# Patient Record
Sex: Female | Born: 1945 | Race: Black or African American | Hispanic: No | Marital: Married | Smoking: Never smoker
Health system: Southern US, Community
[De-identification: ages and names within clinical notes are randomized; demographics above are authoritative.]

## PROBLEM LIST (undated history)

## (undated) DIAGNOSIS — F329 Major depressive disorder, single episode, unspecified: Secondary | ICD-10-CM

## (undated) DIAGNOSIS — I10 Essential (primary) hypertension: Secondary | ICD-10-CM

## (undated) DIAGNOSIS — F419 Anxiety disorder, unspecified: Secondary | ICD-10-CM

## (undated) DIAGNOSIS — F32A Depression, unspecified: Secondary | ICD-10-CM

## (undated) HISTORY — PX: BILATERAL CARPAL TUNNEL RELEASE: SHX6508

## (undated) HISTORY — PX: CARDIAC CATHETERIZATION: SHX172

---

## 1998-10-29 ENCOUNTER — Encounter: Payer: Self-pay | Admitting: Internal Medicine

## 1998-10-29 ENCOUNTER — Ambulatory Visit (HOSPITAL_COMMUNITY): Admission: RE | Admit: 1998-10-29 | Discharge: 1998-10-29 | Payer: Self-pay | Admitting: Internal Medicine

## 1998-11-27 ENCOUNTER — Other Ambulatory Visit: Admission: RE | Admit: 1998-11-27 | Discharge: 1998-11-27 | Payer: Self-pay | Admitting: Internal Medicine

## 2000-02-21 ENCOUNTER — Other Ambulatory Visit: Admission: RE | Admit: 2000-02-21 | Discharge: 2000-02-21 | Payer: Self-pay | Admitting: Internal Medicine

## 2000-03-03 ENCOUNTER — Ambulatory Visit (HOSPITAL_COMMUNITY): Admission: RE | Admit: 2000-03-03 | Discharge: 2000-03-03 | Payer: Self-pay | Admitting: Internal Medicine

## 2000-03-03 ENCOUNTER — Encounter: Payer: Self-pay | Admitting: Internal Medicine

## 2000-12-14 ENCOUNTER — Inpatient Hospital Stay (HOSPITAL_COMMUNITY): Admission: AD | Admit: 2000-12-14 | Discharge: 2000-12-16 | Payer: Self-pay | Admitting: Internal Medicine

## 2000-12-14 ENCOUNTER — Encounter: Payer: Self-pay | Admitting: Internal Medicine

## 2001-04-01 ENCOUNTER — Other Ambulatory Visit: Admission: RE | Admit: 2001-04-01 | Discharge: 2001-04-01 | Payer: Self-pay | Admitting: Internal Medicine

## 2004-11-11 ENCOUNTER — Encounter: Admission: RE | Admit: 2004-11-11 | Discharge: 2004-11-11 | Payer: Self-pay | Admitting: Internal Medicine

## 2006-01-09 ENCOUNTER — Emergency Department (HOSPITAL_COMMUNITY): Admission: EM | Admit: 2006-01-09 | Discharge: 2006-01-09 | Payer: Self-pay | Admitting: Family Medicine

## 2006-01-15 ENCOUNTER — Encounter: Admission: RE | Admit: 2006-01-15 | Discharge: 2006-01-15 | Payer: Self-pay | Admitting: Internal Medicine

## 2006-08-28 ENCOUNTER — Emergency Department (HOSPITAL_COMMUNITY): Admission: EM | Admit: 2006-08-28 | Discharge: 2006-08-28 | Payer: Self-pay | Admitting: Emergency Medicine

## 2008-07-07 ENCOUNTER — Emergency Department (HOSPITAL_COMMUNITY): Admission: EM | Admit: 2008-07-07 | Discharge: 2008-07-07 | Payer: Self-pay | Admitting: Family Medicine

## 2010-05-12 ENCOUNTER — Encounter: Payer: Self-pay | Admitting: Internal Medicine

## 2010-08-26 ENCOUNTER — Inpatient Hospital Stay (INDEPENDENT_AMBULATORY_CARE_PROVIDER_SITE_OTHER)
Admission: RE | Admit: 2010-08-26 | Discharge: 2010-08-26 | Disposition: A | Discharge status: Home / Self Care | Payer: Self-pay | Source: Ambulatory Visit | Attending: Family Medicine | Admitting: Family Medicine

## 2010-08-26 DIAGNOSIS — H5710 Ocular pain, unspecified eye: Secondary | ICD-10-CM

## 2010-09-06 NOTE — H&P (Signed)
[place]  Patient:    Ariel Fisher, Ariel Fisher Visit Number: [id number] MRN: [id number]          Service Type: MED Location: [phone] Attending Physician:  Alva Garnet. Dictated by:   Merlene Laughter Renae Gloss, M.D. Admit Date:  12/14/2000 Discharge Date: 12/16/2000                           History and Physical  CHIEF COMPLAINT:  Chest pain.  HISTORY OF PRESENT ILLNESS:  Ms. Ariel Fisher presents with a three-week history of midsternal chest pain.  She does state that the pain does appear worse after eating; however, she reports pain radiating to her left arm and she also complains of numbness.  She denies shortness of breath or abdominal pain or any other acute constitutional or systemic complaints at this time.  ALLERGIES:  LOTOMEX.  MEDICATIONS:  Maxzide 37.5/25 mg one p.o. q.d.  PAST MEDICAL HISTORY:  Hypertension, generalized anxiety/depression, menopause syndrome, seasonal allergies.  SOCIAL HISTORY:  Ms. Ariel Fisher is married and is self-employed as an Airline pilot. She denies tobacco, alcohol, or drugs of abuse.  FAMILY HISTORY:  Liver cancer in father, who is deceased at 5.  Lymphoma in sister, who is deceased at age 82.  REVIEW OF SYSTEMS:  As per HPI and patients history assessment; otherwise negative systems review.  PHYSICAL EXAMINATION:  VITAL SIGNS:  Blood pressure 130/92, pulse 88, weight 257 pounds, temperature 98.5.  GENERAL:  A well-developed, well-nourished black female in no acute distress but complaining of intermittent chest pain.  HEENT:  TMs within normal limits bilaterally.  No OP lesions.  PERRLA.  NECK:  Supple, no masses.  2+ carotids, no bruits.  CHEST:  Lungs clear to auscultation bilaterally.  CARDIAC:  S1, S2, regular rate and rhythm.  No murmurs, rubs, or gallops.  ABDOMEN:  Soft, nontender, nondistended, positive bowel sounds.  EXTREMITIES:  No clubbing, cyanosis, or edema.  NEUROLOGIC:   Alert and  oriented x 3.  Cranial nerves intact.  ASSESSMENT AND PLAN: 1. Chest pain.  The etiology of Ms. Ariel Fisher chest pain is unclear; however,    given her cardiac risk factor, including an abnormal electrocardiogram,    pain in the office, a cardiac etiology will need to be ruled out.  She will    be admitted to telemetry, and serial cardiac enzymes will be obtained.    Cardiology consult will also be obtained. 2. Hypertension.  Ms. Ariel Fisher blood pressure is fairly well-controlled at this    time.  She will be closely monitored while hospitalized, and medication    adjustments will be made if necessary. Dictated by:   Merlene Laughter Renae Gloss, M.D. Attending Physician:  Andi Devon R. DD:  01/18/01 TD:  01/18/01 Job: [phone] [id number]

## 2010-09-06 NOTE — Discharge Summary (Signed)
[place]  Patient:    Ariel Fisher, Ariel Fisher Visit Number: [id number] MRN: [id number]          Service Type: MED Location: [phone] Attending Physician:  Alva Garnet. Adm. Date:  12/14/2000 Disc. Date: 12/16/2000                             Discharge Summary  DISCHARGE DIAGNOSES: 1. Atypical chest pain. 2. Generalized anxiety. 3. Hypertension. 4. Abnormal EKG with normal heart catheterization December 15, 2000.  CONSULTS:  Dr. Lenise Herald, cardiologist.  HOSPITAL COURSE:  [name] was admitted with a three week history of intermittent mid sternal chest pain radiating to her left arm.  She denied nausea, shortness of breath, palpitations, diaphoresis associated with the chest pain; however, EKG obtained in the office revealed T-wave inversions in V3, aVF, and several other anterior leads.  She was admitted for further evaluation of chest pain in order to rule out a myocardial etiology.  Troponin and CK enzymes were unremarkable; however, given her cardiac risk factors including hypertension, obesity, and family history, it was thought that proceeding with the cardiac catheterization would be the best diagnostic study.  Cardiac catheterization per Dr. Jenne Campus on December 15, 2000 was unremarkable as she had no blockages in her coronary arteries.  Her ejection fraction was between 50-55%.  The most probable etiology for chest pain is a GI cause such as acid peptic disease or musculoskeletal strain.  I also think that her symptoms are exacerbated by general anxiety as a result of both personal and job related stressors.  [name] hypertension was well controlled during this admission.  DISCHARGE MEDICATIONS:  Maxzide 37.5/25 one p.o. q.d.  FOLLOW-UP:  [name] will be seen by Dr. Andi Devon on Tuesday, September 10 at 9:15 a.m.  DISPOSITION:  [name] had no chest pain complaints, was ambulating and eating without  complications. Attending Physician:  Andi Devon R. DD:  12/16/00 TD:  12/16/00 Job: [id number]

## 2010-09-06 NOTE — Cardiovascular Report (Signed)
[place]. [facility]  Patient:    Ariel Fisher, Ariel Fisher Visit Number: [id number] MRN: [id number]          Service Type: MED Location: [phone] Attending Physician:  Alva Garnet. Dictated by:   Lenise Herald, M.D. Proc. Date: 12/15/00 Adm. Date:  12/14/2000   CC:         Merlene Laughter. Renae Gloss, M.D.   Cardiac Catheterization  PROCEDURES: 1. Left heart catheterization. 2. Coronary angiography. 3. Left ventriculogram.  ATTENDING:  Lenise Herald, M.D.  COMPLICATIONS:  None.  INDICATIONS:  Ariel Fisher is a 65 year old female patient of Dr. Cala Bradford R. Renae Gloss, with a history of hypertension and mild obesity.  She was admitted on December 14, 2000 with chest pain radiating to her back and to her left arm.  The patient subsequently ruled out for myocardial infarction; however, given her recurrent symptoms with chest pain while in the hospital, she is now referred for cardiac catheterization to define her coronary anatomy.  DESCRIPTION OF PROCEDURE:  After getting an informed written consent, the patient was brought to the cardiac catheterization laboratory, where the right and left groins were shaved, prepped and draped in the usual sterile fashion. EKG monitoring was established.  Using the modified Seldinger technique, a 6-French arterial sheath was inserted in the right femoral artery.  Then 6-French diagnostic catheters were used to perform diagnostic angiography.  ANGIOGRAPHY: 1. LEFT MAIN:  Large with no significant disease. 2. LEFT ANTERIOR DESCENDING ARTERY:  A large vessel which coursed to the    apex.  There is one diagonal branch.  The LAD has no significant disease.    The first diagonal is a medium-sized vessel which bifurcated distally    and had no significant disease. 3. LEFT CIRCUMFLEX:  A large vessel which is dominant and gives rise to two    obtuse marginal branches as well as a PDA.  The AV circumflex has no    significant  disease.  The first OM is a large vessel which bifurcates    distally and has no significant disease.  The second OM is a medium-sized    vessel with no significant disease.  The PDA is a medium-sized vessel with    no significant disease. 4. RIGHT CORONARY ARTERY:  A small vessel which is nondominant. There is a    mild 30% ostial narrowing.  LEFT VENTRICULOGRAM:  Reveals preserved EF at 50-55%.  HEMODYNAMICS: 1. Arterial pressure:  103/70. 2. LV pressure:  104/60. 3. LEFT VENTRICULAR END-DIASTOLIC PRESSURE:  12.  CONCLUSIONS: 1. No significant coronary artery disease. 2. Normal left ventricular systolic function.Dictated by:   Lenise Herald, M.D. Attending Physician:  Andi Devon R. DD:  12/15/00 TD:  12/16/00 Job: [id number]

## 2012-01-12 ENCOUNTER — Other Ambulatory Visit: Payer: Self-pay | Admitting: Internal Medicine

## 2012-01-12 ENCOUNTER — Ambulatory Visit
Admission: RE | Admit: 2012-01-12 | Discharge: 2012-01-12 | Disposition: A | Discharge status: Home / Self Care | Payer: No Typology Code available for payment source | Source: Ambulatory Visit | Attending: Internal Medicine | Admitting: Internal Medicine

## 2012-01-12 DIAGNOSIS — R0789 Other chest pain: Secondary | ICD-10-CM

## 2012-05-24 ENCOUNTER — Other Ambulatory Visit: Payer: Self-pay | Admitting: Internal Medicine

## 2012-05-24 DIAGNOSIS — Z1231 Encounter for screening mammogram for malignant neoplasm of breast: Secondary | ICD-10-CM

## 2012-06-04 ENCOUNTER — Ambulatory Visit: Payer: Self-pay

## 2012-06-07 ENCOUNTER — Other Ambulatory Visit: Payer: Self-pay | Admitting: Orthopedic Surgery

## 2012-06-07 DIAGNOSIS — M545 Low back pain, unspecified: Secondary | ICD-10-CM

## 2012-06-07 DIAGNOSIS — M25552 Pain in left hip: Secondary | ICD-10-CM

## 2012-06-08 ENCOUNTER — Ambulatory Visit
Admission: RE | Admit: 2012-06-08 | Discharge: 2012-06-08 | Disposition: A | Discharge status: Home / Self Care | Payer: Medicare HMO | Source: Ambulatory Visit | Attending: Internal Medicine | Admitting: Internal Medicine

## 2012-06-08 DIAGNOSIS — Z1231 Encounter for screening mammogram for malignant neoplasm of breast: Secondary | ICD-10-CM

## 2012-06-12 ENCOUNTER — Ambulatory Visit
Admission: RE | Admit: 2012-06-12 | Discharge: 2012-06-12 | Disposition: A | Discharge status: Home / Self Care | Payer: Medicare HMO | Source: Ambulatory Visit | Attending: Orthopedic Surgery | Admitting: Orthopedic Surgery

## 2012-06-12 DIAGNOSIS — M25552 Pain in left hip: Secondary | ICD-10-CM

## 2012-06-12 DIAGNOSIS — M545 Low back pain, unspecified: Secondary | ICD-10-CM

## 2013-03-10 ENCOUNTER — Other Ambulatory Visit: Payer: Self-pay | Admitting: Neurosurgery

## 2013-03-10 DIAGNOSIS — M48061 Spinal stenosis, lumbar region without neurogenic claudication: Secondary | ICD-10-CM

## 2013-03-18 ENCOUNTER — Ambulatory Visit
Admission: RE | Admit: 2013-03-18 | Discharge: 2013-03-18 | Disposition: A | Discharge status: Home / Self Care | Payer: Medicare HMO | Source: Ambulatory Visit | Attending: Neurosurgery | Admitting: Neurosurgery

## 2013-03-18 VITALS — BP 107/60 | HR 65

## 2013-03-18 DIAGNOSIS — M48061 Spinal stenosis, lumbar region without neurogenic claudication: Secondary | ICD-10-CM

## 2013-03-18 MED ORDER — IOHEXOL 180 MG/ML  SOLN
15.0000 mL | Freq: Once | INTRAMUSCULAR | Status: AC | PRN
  Administered 2013-03-18: 15 mL via INTRATHECAL

## 2013-03-18 MED ORDER — ONDANSETRON HCL 4 MG/2ML IJ SOLN
4.0000 mg | Freq: Once | INTRAMUSCULAR | Status: AC
  Administered 2013-03-18: 4 mg via INTRAMUSCULAR

## 2013-03-18 MED ORDER — DIAZEPAM 5 MG PO TABS
5.0000 mg | ORAL_TABLET | Freq: Once | ORAL | Status: AC
  Administered 2013-03-18: 5 mg via ORAL

## 2013-03-18 MED ORDER — MEPERIDINE HCL 100 MG/ML IJ SOLN
100.0000 mg | Freq: Once | INTRAMUSCULAR | Status: AC
  Administered 2013-03-18: 100 mg via INTRAMUSCULAR

## 2013-03-18 NOTE — Progress Notes (Signed)
Patient states her last dose of Tramadol was at least two days ago.  Dr. Carlota Raspberry in to consent patient (with husband present).  jkl

## 2013-05-04 ENCOUNTER — Other Ambulatory Visit: Payer: Self-pay | Admitting: Neurosurgery

## 2013-07-26 ENCOUNTER — Encounter (HOSPITAL_COMMUNITY): Payer: Self-pay | Admitting: Pharmacy Technician

## 2013-07-28 NOTE — Pre-Procedure Instructions (Signed)
Ariel Fisher  07/28/2013   Your procedure is scheduled on:  Mon, April 20 @ 7:30 AM  [facility]  at 5:30 AM.  Call this number if you have problems the morning of surgery: [phone]   Remember:   Do not eat food or drink liquids after midnight.   Take these medicines the morning of surgery with A SIP OF WATER: Alprazolam(Xanax),Amlodipine(Norvasc),and Pain Pill(if needed)              Stop taking your Ibuprofen and Fish Oil. No Goody's,BC's,Aleve,Aspirin,or any Herbal Medications   Do not wear jewelry, make-up or nail polish.  Do not wear lotions, powders, or perfumes. You may wear deodorant.  Do not shave 48 hours prior to surgery.  Do not bring valuables to the hospital.  [facility] not responsible                  for any belongings or valuables.               Contacts, dentures or bridgework may not be worn into surgery.  Leave suitcase in the car. After surgery it may be brought to your room.  For patients admitted to the hospital, discharge time is determined by your                treatment team.                   Special Instructions:  [place] - Preparing for Surgery  Before surgery, you can play an important role.  Because skin is not sterile, your skin needs to be as free of germs as possible.  You can reduce the number of germs on you skin by washing with CHG (chlorahexidine gluconate) soap before surgery.  CHG is an antiseptic cleaner which kills germs and bonds with the skin to continue killing germs even after washing.  Please DO NOT use if you have an allergy to CHG or antibacterial soaps.  If your skin becomes reddened/irritated stop using the CHG and inform your nurse when you arrive at Short Stay.  Do not shave (including legs and underarms) for at least 48 hours prior to the first CHG shower.  You may shave your face.  Please follow these instructions carefully:   1.  Shower with CHG Soap the night before surgery and the                                 morning of Surgery.  2.  If you choose to wash your hair, wash your hair first as usual with your       normal shampoo.  3.  After you shampoo, rinse your hair and body thoroughly to remove the                      Shampoo.  4.  Use CHG as you would any other liquid soap.  You can apply chg directly       to the skin and wash gently with scrungie or a clean washcloth.  5.  Apply the CHG Soap to your body ONLY FROM THE NECK DOWN.        Do not use on open wounds or open sores.  Avoid contact with your eyes,       ears, mouth and genitals (private parts).  Wash genitals (private parts)  with your normal soap.  6.  Wash thoroughly, paying special attention to the area where your surgery        will be performed.  7.  Thoroughly rinse your body with warm water from the neck down.  8.  DO NOT shower/wash with your normal soap after using and rinsing off       the CHG Soap.  9.  Pat yourself dry with a clean towel.            10.  Wear clean pajamas.            11.  Place clean sheets on your bed the night of your first shower and do not        sleep with pets.  Day of Surgery  Do not apply any lotions/deoderants the morning of surgery.  Please wear clean clothes to the hospital/surgery center.     Please read over the following fact sheets that you were given: Pain Booklet, Coughing and Deep Breathing, Blood Transfusion Information, Total Joint Packet and MRSA Information

## 2013-07-29 ENCOUNTER — Encounter (HOSPITAL_COMMUNITY): Payer: Self-pay

## 2013-07-29 ENCOUNTER — Encounter (HOSPITAL_COMMUNITY)
Admission: RE | Admit: 2013-07-29 | Discharge: 2013-07-29 | Disposition: A | Discharge status: Home / Self Care | Payer: Medicare HMO | Source: Ambulatory Visit | Attending: Neurosurgery | Admitting: Neurosurgery

## 2013-07-29 DIAGNOSIS — Z01810 Encounter for preprocedural cardiovascular examination: Secondary | ICD-10-CM | POA: Insufficient documentation

## 2013-07-29 DIAGNOSIS — Z01818 Encounter for other preprocedural examination: Secondary | ICD-10-CM | POA: Insufficient documentation

## 2013-07-29 DIAGNOSIS — Z01812 Encounter for preprocedural laboratory examination: Secondary | ICD-10-CM | POA: Insufficient documentation

## 2013-07-29 HISTORY — DX: Major depressive disorder, single episode, unspecified: F32.9

## 2013-07-29 HISTORY — DX: Depression, unspecified: F32.A

## 2013-07-29 HISTORY — DX: Anxiety disorder, unspecified: F41.9

## 2013-07-29 HISTORY — DX: Essential (primary) hypertension: I10

## 2013-07-29 LAB — CBC
HEMATOCRIT: 40.3 % (ref 36.0–46.0)
HEMOGLOBIN: 13.4 g/dL (ref 12.0–15.0)
MCH: 30.8 pg (ref 26.0–34.0)
MCHC: 33.3 g/dL (ref 30.0–36.0)
MCV: 92.6 fL (ref 78.0–100.0)
Platelets: 333 10*3/uL (ref 150–400)
RBC: 4.35 MIL/uL (ref 3.87–5.11)
RDW: 12.7 % (ref 11.5–15.5)
WBC: 5.2 10*3/uL (ref 4.0–10.5)

## 2013-07-29 LAB — TYPE AND SCREEN
ABO/RH(D): A POS
Antibody Screen: NEGATIVE

## 2013-07-29 LAB — BASIC METABOLIC PANEL
BUN: 15 mg/dL (ref 6–23)
CHLORIDE: 107 meq/L (ref 96–112)
CO2: 23 meq/L (ref 19–32)
Calcium: 9.7 mg/dL (ref 8.4–10.5)
Creatinine, Ser: 0.63 mg/dL (ref 0.50–1.10)
GFR calc Af Amer: >90 mL/min (ref >90)
GLUCOSE: 96 mg/dL (ref 70–99)
POTASSIUM: 3.8 meq/L (ref 3.7–5.3)
Sodium: 145 mEq/L (ref 137–147)

## 2013-07-29 LAB — SURGICAL PCR SCREEN
MRSA, PCR: NEGATIVE
Staphylococcus aureus: NEGATIVE

## 2013-07-29 LAB — ABO/RH: ABO/RH(D): A POS

## 2013-08-04 ENCOUNTER — Encounter (HOSPITAL_COMMUNITY): Payer: Self-pay | Admitting: Pharmacy Technician

## 2013-08-08 ENCOUNTER — Encounter (HOSPITAL_COMMUNITY): Payer: Self-pay | Admitting: *Deleted

## 2013-08-08 ENCOUNTER — Encounter (HOSPITAL_COMMUNITY)
Admission: RE | Disposition: A | Discharge status: Home / Self Care | Payer: Medicare HMO | Source: Ambulatory Visit | Attending: Neurosurgery

## 2013-08-08 ENCOUNTER — Inpatient Hospital Stay (HOSPITAL_COMMUNITY): Payer: Medicare HMO | Admitting: Anesthesiology

## 2013-08-08 ENCOUNTER — Inpatient Hospital Stay (HOSPITAL_COMMUNITY)
Admission: RE | Admit: 2013-08-08 | Discharge: 2013-08-12 | DRG: 460 | Disposition: A | Discharge status: Home / Self Care | Payer: Medicare HMO | Source: Ambulatory Visit | Attending: Neurosurgery | Admitting: Neurosurgery

## 2013-08-08 ENCOUNTER — Inpatient Hospital Stay (HOSPITAL_COMMUNITY): Payer: Medicare HMO

## 2013-08-08 ENCOUNTER — Encounter (HOSPITAL_COMMUNITY): Payer: Medicare HMO | Admitting: Anesthesiology

## 2013-08-08 DIAGNOSIS — Z23 Encounter for immunization: Secondary | ICD-10-CM

## 2013-08-08 DIAGNOSIS — Z79899 Other long term (current) drug therapy: Secondary | ICD-10-CM

## 2013-08-08 DIAGNOSIS — F329 Major depressive disorder, single episode, unspecified: Secondary | ICD-10-CM | POA: Diagnosis present

## 2013-08-08 DIAGNOSIS — F411 Generalized anxiety disorder: Secondary | ICD-10-CM | POA: Diagnosis present

## 2013-08-08 DIAGNOSIS — F3289 Other specified depressive episodes: Secondary | ICD-10-CM | POA: Diagnosis present

## 2013-08-08 DIAGNOSIS — Q762 Congenital spondylolisthesis: Principal | ICD-10-CM

## 2013-08-08 DIAGNOSIS — I10 Essential (primary) hypertension: Secondary | ICD-10-CM | POA: Diagnosis present

## 2013-08-08 DIAGNOSIS — M47817 Spondylosis without myelopathy or radiculopathy, lumbosacral region: Secondary | ICD-10-CM | POA: Diagnosis present

## 2013-08-08 DIAGNOSIS — M4316 Spondylolisthesis, lumbar region: Secondary | ICD-10-CM | POA: Diagnosis present

## 2013-08-08 DIAGNOSIS — M5137 Other intervertebral disc degeneration, lumbosacral region: Secondary | ICD-10-CM | POA: Diagnosis present

## 2013-08-08 DIAGNOSIS — M51379 Other intervertebral disc degeneration, lumbosacral region without mention of lumbar back pain or lower extremity pain: Secondary | ICD-10-CM | POA: Diagnosis present

## 2013-08-08 SURGERY — POSTERIOR LUMBAR FUSION 1 LEVEL
Anesthesia: General | Site: Back

## 2013-08-08 MED ORDER — DEXAMETHASONE SODIUM PHOSPHATE 10 MG/ML IJ SOLN
INTRAMUSCULAR | Status: AC
  Filled 2013-08-08: qty 1

## 2013-08-08 MED ORDER — GLYCOPYRROLATE 0.2 MG/ML IJ SOLN
INTRAMUSCULAR | Status: DC | PRN
  Administered 2013-08-08: 0.4 mg via INTRAVENOUS

## 2013-08-08 MED ORDER — FENTANYL CITRATE 0.05 MG/ML IJ SOLN
INTRAMUSCULAR | Status: AC
  Filled 2013-08-08: qty 5

## 2013-08-08 MED ORDER — HYDROMORPHONE HCL PF 1 MG/ML IJ SOLN
0.2500 mg | INTRAMUSCULAR | Status: DC | PRN
  Administered 2013-08-08 (×2): 0.25 mg via INTRAVENOUS
  Administered 2013-08-08 (×2): 0.5 mg via INTRAVENOUS

## 2013-08-08 MED ORDER — DOCUSATE SODIUM 100 MG PO CAPS
100.0000 mg | ORAL_CAPSULE | Freq: Two times a day (BID) | ORAL | Status: DC
  Administered 2013-08-08 – 2013-08-12 (×7): 100 mg via ORAL
  Filled 2013-08-08 (×7): qty 1

## 2013-08-08 MED ORDER — PROPOFOL 10 MG/ML IV BOLUS
INTRAVENOUS | Status: AC
Start: 2013-08-08 — End: 2013-08-08
  Filled 2013-08-08: qty 20

## 2013-08-08 MED ORDER — ACETAMINOPHEN 650 MG RE SUPP: 650.0000 mg | RECTAL | Status: DC | PRN

## 2013-08-08 MED ORDER — ACETAMINOPHEN 325 MG PO TABS
650.0000 mg | ORAL_TABLET | ORAL | Status: DC | PRN
  Administered 2013-08-10: 650 mg via ORAL
  Filled 2013-08-08: qty 2

## 2013-08-08 MED ORDER — HYDROMORPHONE HCL PF 1 MG/ML IJ SOLN
INTRAMUSCULAR | Status: AC
  Filled 2013-08-08: qty 1

## 2013-08-08 MED ORDER — NEOSTIGMINE METHYLSULFATE 1 MG/ML IJ SOLN
INTRAMUSCULAR | Status: AC
  Filled 2013-08-08: qty 10

## 2013-08-08 MED ORDER — NEOSTIGMINE METHYLSULFATE 1 MG/ML IJ SOLN
INTRAMUSCULAR | Status: DC | PRN
  Administered 2013-08-08: 3 mg via INTRAVENOUS

## 2013-08-08 MED ORDER — PHENYLEPHRINE 40 MCG/ML (10ML) SYRINGE FOR IV PUSH (FOR BLOOD PRESSURE SUPPORT)
PREFILLED_SYRINGE | INTRAVENOUS | Status: AC
  Filled 2013-08-08: qty 10

## 2013-08-08 MED ORDER — CEFAZOLIN SODIUM-DEXTROSE 2-3 GM-% IV SOLR
INTRAVENOUS | Status: DC | PRN
  Administered 2013-08-08: 2 g via INTRAVENOUS

## 2013-08-08 MED ORDER — ONDANSETRON HCL 4 MG/2ML IJ SOLN
INTRAMUSCULAR | Status: DC | PRN
  Administered 2013-08-08: 4 mg via INTRAVENOUS

## 2013-08-08 MED ORDER — BACITRACIN 50000 UNITS IM SOLR
INTRAMUSCULAR | Status: DC | PRN
  Administered 2013-08-08: 09:00:00

## 2013-08-08 MED ORDER — ONDANSETRON HCL 4 MG/2ML IJ SOLN
4.0000 mg | INTRAMUSCULAR | Status: DC | PRN
  Administered 2013-08-10 (×2): 4 mg via INTRAVENOUS
  Filled 2013-08-08 (×2): qty 2

## 2013-08-08 MED ORDER — OXYCODONE HCL 5 MG/5ML PO SOLN: 5.0000 mg | Freq: Once | ORAL | Status: AC | PRN

## 2013-08-08 MED ORDER — DIAZEPAM 5 MG PO TABS
ORAL_TABLET | ORAL | Status: AC
  Filled 2013-08-08: qty 1

## 2013-08-08 MED ORDER — MIDAZOLAM HCL 5 MG/5ML IJ SOLN
INTRAMUSCULAR | Status: DC | PRN
  Administered 2013-08-08: 2 mg via INTRAVENOUS

## 2013-08-08 MED ORDER — THROMBIN 20000 UNITS EX SOLR
CUTANEOUS | Status: DC | PRN
  Administered 2013-08-08: 09:00:00 via TOPICAL

## 2013-08-08 MED ORDER — ROCURONIUM BROMIDE 50 MG/5ML IV SOLN
INTRAVENOUS | Status: AC
  Filled 2013-08-08: qty 1

## 2013-08-08 MED ORDER — OXYCODONE HCL 5 MG PO TABS
5.0000 mg | ORAL_TABLET | Freq: Once | ORAL | Status: AC | PRN
  Administered 2013-08-08: 5 mg via ORAL

## 2013-08-08 MED ORDER — EPHEDRINE SULFATE 50 MG/ML IJ SOLN
INTRAMUSCULAR | Status: AC
  Filled 2013-08-08: qty 1

## 2013-08-08 MED ORDER — PROMETHAZINE HCL 25 MG/ML IJ SOLN: 6.2500 mg | INTRAMUSCULAR | Status: DC | PRN

## 2013-08-08 MED ORDER — MORPHINE SULFATE 2 MG/ML IJ SOLN
1.0000 mg | INTRAMUSCULAR | Status: DC | PRN
  Administered 2013-08-08 – 2013-08-09 (×3): 2 mg via INTRAVENOUS
  Administered 2013-08-09: 4 mg via INTRAVENOUS
  Filled 2013-08-08 (×4): qty 1
  Filled 2013-08-08: qty 2

## 2013-08-08 MED ORDER — BUPIVACAINE-EPINEPHRINE PF 0.5-1:200000 % IJ SOLN
INTRAMUSCULAR | Status: DC | PRN
  Administered 2013-08-08: 10 mL via PERINEURAL

## 2013-08-08 MED ORDER — PNEUMOCOCCAL VAC POLYVALENT 25 MCG/0.5ML IJ INJ
0.5000 mL | INJECTION | INTRAMUSCULAR | Status: AC
  Administered 2013-08-09: 0.5 mL via INTRAMUSCULAR
  Filled 2013-08-08: qty 0.5

## 2013-08-08 MED ORDER — ROCURONIUM BROMIDE 100 MG/10ML IV SOLN
INTRAVENOUS | Status: DC | PRN
  Administered 2013-08-08: 50 mg via INTRAVENOUS

## 2013-08-08 MED ORDER — SUCCINYLCHOLINE CHLORIDE 20 MG/ML IJ SOLN
INTRAMUSCULAR | Status: AC
  Filled 2013-08-08: qty 1

## 2013-08-08 MED ORDER — LIDOCAINE HCL (CARDIAC) 20 MG/ML IV SOLN
INTRAVENOUS | Status: AC
  Filled 2013-08-08: qty 5

## 2013-08-08 MED ORDER — 0.9 % SODIUM CHLORIDE (POUR BTL) OPTIME
TOPICAL | Status: DC | PRN
  Administered 2013-08-08: 1000 mL

## 2013-08-08 MED ORDER — CEFAZOLIN SODIUM-DEXTROSE 2-3 GM-% IV SOLR
2.0000 g | Freq: Three times a day (TID) | INTRAVENOUS | Status: AC
  Administered 2013-08-08 (×2): 2 g via INTRAVENOUS
  Filled 2013-08-08 (×2): qty 50

## 2013-08-08 MED ORDER — DIAZEPAM 5 MG PO TABS
5.0000 mg | ORAL_TABLET | Freq: Four times a day (QID) | ORAL | Status: DC | PRN
  Administered 2013-08-08 – 2013-08-09 (×4): 5 mg via ORAL
  Filled 2013-08-08 (×3): qty 1

## 2013-08-08 MED ORDER — PHENOL 1.4 % MT LIQD: 1.0000 | OROMUCOSAL | Status: DC | PRN

## 2013-08-08 MED ORDER — SODIUM CHLORIDE 0.9 % IJ SOLN
INTRAMUSCULAR | Status: AC
  Filled 2013-08-08: qty 10

## 2013-08-08 MED ORDER — OXYCODONE-ACETAMINOPHEN 5-325 MG PO TABS
1.0000 | ORAL_TABLET | ORAL | Status: DC | PRN
  Administered 2013-08-08 – 2013-08-12 (×16): 2 via ORAL
  Filled 2013-08-08 (×16): qty 2

## 2013-08-08 MED ORDER — BACITRACIN ZINC 500 UNIT/GM EX OINT
TOPICAL_OINTMENT | CUTANEOUS | Status: DC | PRN
  Administered 2013-08-08: 1 via TOPICAL

## 2013-08-08 MED ORDER — HYDROCODONE-ACETAMINOPHEN 10-325 MG PO TABS: 1.0000 | ORAL_TABLET | ORAL | Status: DC | PRN

## 2013-08-08 MED ORDER — ONDANSETRON HCL 4 MG/2ML IJ SOLN
INTRAMUSCULAR | Status: AC
  Filled 2013-08-08: qty 2

## 2013-08-08 MED ORDER — WHITE PETROLATUM GEL
Status: AC
  Filled 2013-08-08: qty 5

## 2013-08-08 MED ORDER — PHENYLEPHRINE HCL 10 MG/ML IJ SOLN
INTRAMUSCULAR | Status: AC
  Filled 2013-08-08: qty 1

## 2013-08-08 MED ORDER — LACTATED RINGERS IV SOLN
INTRAVENOUS | Status: DC | PRN
  Administered 2013-08-08 (×3): via INTRAVENOUS

## 2013-08-08 MED ORDER — BUPIVACAINE LIPOSOME 1.3 % IJ SUSP
20.0000 mL | INTRAMUSCULAR | Status: DC
  Filled 2013-08-08: qty 20

## 2013-08-08 MED ORDER — PHENYLEPHRINE HCL 10 MG/ML IJ SOLN
INTRAMUSCULAR | Status: DC | PRN
  Administered 2013-08-08 (×4): 80 ug via INTRAVENOUS

## 2013-08-08 MED ORDER — LIDOCAINE HCL (CARDIAC) 20 MG/ML IV SOLN
INTRAVENOUS | Status: DC | PRN
  Administered 2013-08-08: 50 mg via INTRAVENOUS

## 2013-08-08 MED ORDER — DEXAMETHASONE SODIUM PHOSPHATE 10 MG/ML IJ SOLN
INTRAMUSCULAR | Status: DC | PRN
  Administered 2013-08-08: 10 mg via INTRAVENOUS

## 2013-08-08 MED ORDER — ALUM & MAG HYDROXIDE-SIMETH 200-200-20 MG/5ML PO SUSP: 30.0000 mL | Freq: Four times a day (QID) | ORAL | Status: DC | PRN

## 2013-08-08 MED ORDER — AMLODIPINE BESYLATE 2.5 MG PO TABS
2.5000 mg | ORAL_TABLET | Freq: Every day | ORAL | Status: DC
  Administered 2013-08-09 – 2013-08-12 (×2): 2.5 mg via ORAL
  Filled 2013-08-08 (×5): qty 1

## 2013-08-08 MED ORDER — PHENYLEPHRINE HCL 10 MG/ML IJ SOLN
10.0000 mg | INTRAVENOUS | Status: DC | PRN
  Administered 2013-08-08: 25 ug/min via INTRAVENOUS

## 2013-08-08 MED ORDER — MIDAZOLAM HCL 2 MG/2ML IJ SOLN
INTRAMUSCULAR | Status: AC
  Filled 2013-08-08: qty 2

## 2013-08-08 MED ORDER — PROPOFOL 10 MG/ML IV BOLUS
INTRAVENOUS | Status: DC | PRN
  Administered 2013-08-08: 130 mg via INTRAVENOUS

## 2013-08-08 MED ORDER — MENTHOL 3 MG MT LOZG: 1.0000 | LOZENGE | OROMUCOSAL | Status: DC | PRN

## 2013-08-08 MED ORDER — GLYCOPYRROLATE 0.2 MG/ML IJ SOLN
INTRAMUSCULAR | Status: AC
  Filled 2013-08-08: qty 2

## 2013-08-08 MED ORDER — BUPIVACAINE LIPOSOME 1.3 % IJ SUSP
INTRAMUSCULAR | Status: DC | PRN
  Administered 2013-08-08: 20 mL

## 2013-08-08 MED ORDER — FENTANYL CITRATE 0.05 MG/ML IJ SOLN
INTRAMUSCULAR | Status: DC | PRN
  Administered 2013-08-08 (×2): 100 ug via INTRAVENOUS
  Administered 2013-08-08 (×2): 50 ug via INTRAVENOUS
  Administered 2013-08-08 (×2): 100 ug via INTRAVENOUS
  Administered 2013-08-08 (×3): 50 ug via INTRAVENOUS
  Administered 2013-08-08: 100 ug via INTRAVENOUS

## 2013-08-08 MED ORDER — OXYCODONE HCL 5 MG PO TABS
ORAL_TABLET | ORAL | Status: AC
  Filled 2013-08-08: qty 1

## 2013-08-08 MED ORDER — LACTATED RINGERS IV SOLN
INTRAVENOUS | Status: DC
  Administered 2013-08-09: 1000 mL via INTRAVENOUS

## 2013-08-08 SURGICAL SUPPLY — 70 items
APL SKNCLS STERI-STRIP NONHPOA (GAUZE/BANDAGES/DRESSINGS) ×1
BAG DECANTER FOR FLEXI CONT (MISCELLANEOUS) ×2 IMPLANT
BENZOIN TINCTURE PRP APPL 2/3 (GAUZE/BANDAGES/DRESSINGS) ×2 IMPLANT
BLADE SURG ROTATE 9660 (MISCELLANEOUS) IMPLANT
BRUSH SCRUB EZ PLAIN DRY (MISCELLANEOUS) ×2 IMPLANT
BUR ACORN 6.0 (BURR) ×2 IMPLANT
BUR MATCHSTICK NEURO 3.0 LAGG (BURR) ×2 IMPLANT
CANISTER SUCT 3000ML (MISCELLANEOUS) ×2 IMPLANT
CAP REVERE LOCKING (Cap) ×4 IMPLANT
CONT SPEC 4OZ CLIKSEAL STRL BL (MISCELLANEOUS) ×2 IMPLANT
COVER BACK TABLE 24X17X13 BIG (DRAPES) IMPLANT
COVER TABLE BACK 60X90 (DRAPES) ×2 IMPLANT
DRAPE C-ARM 42X72 X-RAY (DRAPES) ×4 IMPLANT
DRAPE LAPAROTOMY 100X72X124 (DRAPES) ×2 IMPLANT
DRAPE POUCH INSTRU U-SHP 10X18 (DRAPES) ×2 IMPLANT
DRAPE PROXIMA HALF (DRAPES) ×2 IMPLANT
DRAPE SURG 17X23 STRL (DRAPES) ×8 IMPLANT
ELECT BLADE 4.0 EZ CLEAN MEGAD (MISCELLANEOUS) ×2
ELECT REM PT RETURN 9FT ADLT (ELECTROSURGICAL) ×2
ELECTRODE BLDE 4.0 EZ CLN MEGD (MISCELLANEOUS) ×1 IMPLANT
ELECTRODE REM PT RTRN 9FT ADLT (ELECTROSURGICAL) ×1 IMPLANT
EVACUATOR 1/8 PVC DRAIN (DRAIN) ×1 IMPLANT
GAUZE SPONGE 4X4 16PLY XRAY LF (GAUZE/BANDAGES/DRESSINGS) ×2 IMPLANT
GLOVE BIO SURGEON STRL SZ8.5 (GLOVE) ×4 IMPLANT
GLOVE BIOGEL PI IND STRL 8.5 (GLOVE) IMPLANT
GLOVE BIOGEL PI INDICATOR 8.5 (GLOVE) ×1
GLOVE ECLIPSE 8.5 STRL (GLOVE) ×1 IMPLANT
GLOVE EXAM NITRILE LRG STRL (GLOVE) IMPLANT
GLOVE EXAM NITRILE MD LF STRL (GLOVE) IMPLANT
GLOVE EXAM NITRILE XL STR (GLOVE) IMPLANT
GLOVE EXAM NITRILE XS STR PU (GLOVE) IMPLANT
GLOVE SS BIOGEL STRL SZ 8 (GLOVE) ×2 IMPLANT
GLOVE SUPERSENSE BIOGEL SZ 8 (GLOVE) ×2
GOWN BRE IMP SLV AUR LG STRL (GOWN DISPOSABLE) IMPLANT
GOWN BRE IMP SLV AUR XL STRL (GOWN DISPOSABLE) ×2 IMPLANT
GOWN STRL REIN 2XL LVL4 (GOWN DISPOSABLE) IMPLANT
GOWN STRL REUS W/ TWL XL LVL3 (GOWN DISPOSABLE) IMPLANT
GOWN STRL REUS W/TWL 2XL LVL3 (GOWN DISPOSABLE) ×1 IMPLANT
GOWN STRL REUS W/TWL XL LVL3 (GOWN DISPOSABLE) ×6
KIT BASIN OR (CUSTOM PROCEDURE TRAY) ×2 IMPLANT
KIT ROOM TURNOVER OR (KITS) ×2 IMPLANT
NDL HYPO 21X1.5 SAFETY (NEEDLE) IMPLANT
NEEDLE HYPO 21X1.5 SAFETY (NEEDLE) ×2 IMPLANT
NEEDLE HYPO 22GX1.5 SAFETY (NEEDLE) ×2 IMPLANT
NS IRRIG 1000ML POUR BTL (IV SOLUTION) ×2 IMPLANT
PACK FOAM VITOSS 10CC (Orthopedic Implant) ×1 IMPLANT
PACK LAMINECTOMY NEURO (CUSTOM PROCEDURE TRAY) ×2 IMPLANT
PAD ARMBOARD 7.5X6 YLW CONV (MISCELLANEOUS) ×6 IMPLANT
PATTIES SURGICAL .5 X1 (DISPOSABLE) IMPLANT
PUTTY 10ML ACTIFUSE ABX (Putty) ×1 IMPLANT
ROD REVERE 6.35 40MM (Rod) ×2 IMPLANT
SCREW 7.5X50MM (Screw) ×2 IMPLANT
SCREW REVERE 6.35 75X55MM (Screw) ×2 IMPLANT
SPACER SUSTAIN O 10X26 13MM (Spacer) ×2 IMPLANT
SPONGE GAUZE 4X4 12PLY (GAUZE/BANDAGES/DRESSINGS) ×2 IMPLANT
SPONGE LAP 4X18 X RAY DECT (DISPOSABLE) ×1 IMPLANT
SPONGE NEURO XRAY DETECT 1X3 (DISPOSABLE) IMPLANT
SPONGE SURGIFOAM ABS GEL 100 (HEMOSTASIS) ×2 IMPLANT
STRIP CLOSURE SKIN 1/2X4 (GAUZE/BANDAGES/DRESSINGS) ×2 IMPLANT
SUT VIC AB 1 CT1 18XBRD ANBCTR (SUTURE) ×2 IMPLANT
SUT VIC AB 1 CT1 8-18 (SUTURE) ×4
SUT VIC AB 2-0 CP2 18 (SUTURE) ×4 IMPLANT
SYR 20CC LL (SYRINGE) ×1 IMPLANT
SYR 20ML ECCENTRIC (SYRINGE) ×2 IMPLANT
TAPE CLOTH SURG 4X10 WHT LF (GAUZE/BANDAGES/DRESSINGS) ×1 IMPLANT
TAPE STRIPS DRAPE STRL (GAUZE/BANDAGES/DRESSINGS) ×1 IMPLANT
TOWEL OR 17X24 6PK STRL BLUE (TOWEL DISPOSABLE) ×2 IMPLANT
TOWEL OR 17X26 10 PK STRL BLUE (TOWEL DISPOSABLE) ×2 IMPLANT
TRAY FOLEY CATH 14FRSI W/METER (CATHETERS) ×2 IMPLANT
WATER STERILE IRR 1000ML POUR (IV SOLUTION) ×2 IMPLANT

## 2013-08-08 NOTE — Progress Notes (Signed)
Orthopedic Tech Progress Note Patient Details:  Ariel Fisher Aug 25, 1945 [id number]  Patient ID: Ariel MaduroPatsye Y Resnick, female   DOB: Aug 25, 1945, 68 y.o.   MRN: [id number] Brace order completed by Janan HalterBio-Tech  Himmat Enberg 08/08/2013, 1:43 PM

## 2013-08-08 NOTE — Transfer of Care (Signed)
Immediate Anesthesia Transfer of Care Note  Patient: Veva Kandyce RudY Harting  Procedure(s) Performed: Procedure(s) with comments: POSTERIOR LUMBAR FUSION 1 LEVEL (N/A) - L45 laminectomy with posterior lumbar interbody fusion with interbody prosthesis posterior lateral arthrodesis and posterior nonsegmental instrumentation  Patient Location: PACU  Anesthesia Type:General  Level of Consciousness: awake, alert  and patient cooperative  Airway & Oxygen Therapy: Patient Spontanous Breathing and Patient connected to nasal cannula oxygen  Post-op Assessment: Report given to PACU RN, Post -op Vital signs reviewed and stable and Patient moving all extremities  Post vital signs: Reviewed and stable  Complications: No apparent anesthesia complications

## 2013-08-08 NOTE — Progress Notes (Signed)
Pt admitted to 4N11 from PACU.  Both Foley and hemovac in place and draining.  Dressing to back clean, dry and intact.  Pt c/o pain being a 9 at present but PACU medicated her just before transport.  She c/o some numbness in hands but otherwise no other complaints.  Many family members at bedside.  Will continue to monitor.

## 2013-08-08 NOTE — Anesthesia Postprocedure Evaluation (Signed)
  Anesthesia Post-op Note  Patient: Ariel Fisher  Procedure(s) Performed: Procedure(s) with comments: POSTERIOR LUMBAR FUSION 1 LEVEL (N/A) - L45 laminectomy with posterior lumbar interbody fusion with interbody prosthesis posterior lateral arthrodesis and posterior nonsegmental instrumentation  Patient Location: PACU  Anesthesia Type:General  Level of Consciousness: awake and alert   Airway and Oxygen Therapy: Patient Spontanous Breathing  Post-op Pain: mild  Post-op Assessment: Post-op Vital signs reviewed  Post-op Vital Signs: stable  Last Vitals:  Filed Vitals:   08/08/13 1305  BP:   Pulse: 63  Temp: 36 C  Resp: 10    Complications: No apparent anesthesia complications

## 2013-08-08 NOTE — Op Note (Signed)
Brief history: The patient is a 68 year old black female who has complained of back, buttock, and leg pain consistent with neurogenic claudication. She has failed medical management and was worked up with a lumbar MRI and lumbar x-rays. This demonstrated severe facet arthropathy, stenosis, and a spondylolisthesis at L4-5. I discussed the various treatment options with the patient including surgery. The patient has weighed the risks, benefits, and alternatives surgery and decided proceed with an L4-5 decompression, instrumentation, and fusion.  Preoperative diagnosis: L4-5 spondylolisthesis, Degenerative disc disease, spinal stenosis compressing both the L4 and the L5 nerve roots; lumbago; lumbar radiculopathy  Postoperative diagnosis: The same  Procedure: Bilateral L4 Laminotomy/foraminotomies to decompress the bilateral L4 and L5 nerve roots(the work required to do this was in addition to the work required to do the posterior lumbar interbody fusion because of the patient's spinal stenosis, facet arthropathy. Etc. requiring a wide decompression of the nerve roots.); L4-5 posterior lumbar interbody fusion with local morselized autograft bone and Actifusebone graft extender; insertion of interbody prosthesis at L4-5 (globus peek interbody prosthesis); posterior nonsegmental instrumentation from L4 to L5 with globus titanium pedicle screws and rods; posterior lateral arthrodesis at L4-5 with local morselized autograft bone and Vitoss bone graft extender.  Surgeon: Dr. Delma OfficerJeff Teresa Nicodemus  Asst.: Dr. Barnett AbuHenry Elsner  Anesthesia: Gen. endotracheal  Estimated blood loss: 300 cc  Drains: One medium Hemovac  Complications: None  Description of procedure: The patient was brought to the operating room by the anesthesia team. General endotracheal anesthesia was induced. The patient was turned to the prone position on the Wilson frame. The patient's lumbosacral region was then prepared with Betadine scrub and  Betadine solution. Sterile drapes were applied.  I then injected the area to be incised with Marcaine with epinephrine solution. I then used the scalpel to make a linear midline incision over the L4-5 interspace. I then used electrocautery to perform a bilateral subperiosteal dissection exposing the spinous process and lamina of L3, L4 and L5. We then obtained intraoperative radiograph to confirm our location. We then inserted the Verstrac retractor to provide exposure.  I began the decompression by using the high speed drill to perform laminotomies at L4 bilaterally. We then used the Kerrison punches to widen the laminotomy and removed the ligamentum flavum at L4-5 bilaterally and a cephalad aspect of the bilateral L5 lamina. We used the Kerrison punches to remove the medial facets at L4-5. We performed wide foraminotomies about the bilateral L4 and L5 nerve roots completing the decompression.  We now turned our attention to the posterior lumbar interbody fusion. I used a scalpel to incise the intervertebral disc at L4-5. I then performed a partial intervertebral discectomy at L4-5 using the pituitary forceps. We prepared the vertebral endplates at L4-5 for the fusion by removing the soft tissues with the curettes. We then used the trial spacers to pick the appropriate sized interbody prosthesis. We prefilled his prosthesis with a combination of local morselized autograft bone that we obtained during the decompression as well as Actifuse bone graft extender. We inserted the prefilled prosthesis into the interspace at L4-5. There was a good snug fit of the prosthesis in the interspace. We then filled and the remainder of the intervertebral disc space with local morselized autograft bone and Actifuse. This completed the posterior lumbar interbody arthrodesis.  We now turned attention to the instrumentation. Under fluoroscopic guidance we cannulated the bilateral L4 and L5 pedicles with the bone probe. We then  removed the bone probe. We then tapped  the pedicle with a 6.5 millimeter tap. We then removed the tap. We probed inside the tapped pedicle with a ball probe to rule out cortical breaches. We then inserted a 7.5 x 50 and 55 millimeter pedicle screw into the L4 and L5 pedicles bilaterally under fluoroscopic guidance. We then palpated along the medial aspect of the pedicles to rule out cortical breaches. There were none. The nerve roots were not injured. We then connected the unilateral pedicle screws with a lordotic rod. We compressed the construct and secured the rod in place with the caps. We then tightened the caps appropriately. This completed the instrumentation from L4-5.  We now turned our attention to the posterior lateral arthrodesis at L4-5. We used the high-speed drill to decorticate the remainder of the facets, pars, transverse process at L4-5. We then applied a combination of local morselized autograft bone and Vitoss bone graft extender over these decorticated posterior lateral structures. This completed the posterior lateral arthrodesis.  We then obtained hemostasis using bipolar electrocautery. We irrigated the wound out with bacitracin solution. We inspected the thecal sac and nerve roots and noted they were well decompressed. We then removed the retractor. We placed a medium Hemovac drain in the epidural space and tunneled out through separate stab wound. We reapproximated patient's thoracolumbar fascia with interrupted #1 Vicryl suture. We reapproximated patient's subcutaneous tissue with interrupted 2-0 Vicryl suture. The reapproximated patient's skin with Steri-Strips and benzoin. The wound was then coated with bacitracin ointment. A sterile dressing was applied. The drapes were removed. The patient was subsequently returned to the supine position where they were extubated by the anesthesia team. He was then transported to the post anesthesia care unit in stable condition. All sponge  instrument and needle counts were reportedly correct at the end of this case.

## 2013-08-08 NOTE — Progress Notes (Signed)
Pt c/o slight numbness to left great toe.  Had remained on back despite offers to help turn patient.  She was assisted to her right side with pillows behind back.  Dressing remains clean, dry, and intact.  Minimal amount draining to hemovac.  No other complaints.  Will continue to monitor.

## 2013-08-08 NOTE — Progress Notes (Signed)
Report to R. Theatre managerHunt RN

## 2013-08-08 NOTE — Progress Notes (Signed)
Pt complaint of foley leaking. Medicated pt with 2mg  morphine prior to ambulation and foley removal. Pt tolerated fairly well. Pt stood with brace and walker and ambulated a few steps and back to bed. Tolerated foley removal well at 2230. Pt is happy to have catheter out. Will monitor for void within 6 hours. Will continue to monitor.

## 2013-08-08 NOTE — Anesthesia Preprocedure Evaluation (Addendum)
Anesthesia Evaluation  Patient identified by MRN, date of birth, ID band Patient awake    Reviewed: Allergy & Precautions, H&P , NPO status , Patient's Chart, lab work & pertinent test results  Airway Mallampati: I TM Distance: >3 FB Neck ROM: Full    Dental  (+) Teeth Intact, Dental Advisory Given   Pulmonary neg pulmonary ROS,  breath sounds clear to auscultation        Cardiovascular hypertension, Rhythm:Regular Rate:Normal     Neuro/Psych Anxiety Depression    GI/Hepatic   Endo/Other    Renal/GU      Musculoskeletal  (+) Arthritis -,   Abdominal (+) + obese,   Peds  Hematology   Anesthesia Other Findings   Reproductive/Obstetrics                          Anesthesia Physical Anesthesia Plan  ASA: III  Anesthesia Plan: General   Post-op Pain Management:    Induction: Intravenous  Airway Management Planned: Oral ETT  Additional Equipment:   Intra-op Plan:   Post-operative Plan: Extubation in OR  Informed Consent: I have reviewed the patients History and Physical, chart, labs and discussed the procedure including the risks, benefits and alternatives for the proposed anesthesia with the patient or authorized representative who has indicated his/her understanding and acceptance.   Dental advisory given  Plan Discussed with: CRNA and Surgeon  Anesthesia Plan Comments:         Anesthesia Quick Evaluation

## 2013-08-08 NOTE — H&P (Signed)
Subjective: The patient is a 68 year old black female who has complained of back and leg pain consistent with neurogenic claudication. She has failed medical management and was worked up with a lumbar MRI and x-rays. This demonstrated spinal stenosis and a spondylolisthesis at L4-5. I discussed the various treatment option with the patient including surgery. She has weighed the risks, benefits, and alternatives surgery decided proceed with an L4-5 decompression, instrumentation, and fusion.   Past Medical History  Diagnosis Date  . Hypertension   . Depression   . Anxiety     Past Surgical History  Procedure Laterality Date  . Cardiac catheterization      15 yrs ago ..clean  . Cesarean section      No Known Allergies  History  Substance Use Topics  . Smoking status: Never Smoker   . Smokeless tobacco: Not on file  . Alcohol Use: Yes     Comment: occ    History reviewed. No pertinent family history. Prior to Admission medications   Medication Sig Start Date End Date Taking? Authorizing Provider  ALPRAZolam Prudy Feeler(XANAX) 0.5 MG tablet Take 0.5 mg by mouth 2 (two) times daily as needed for anxiety or sleep.   Yes Historical Provider, MD  amLODipine (NORVASC) 2.5 MG tablet Take 2.5 mg by mouth daily.   Yes Historical Provider, MD  CINNAMON PO Take 2 tablets by mouth daily.   Yes Historical Provider, MD  Flaxseed, Linseed, (FLAX SEED OIL PO) Take 1 tablet by mouth daily.   Yes Historical Provider, MD  HYDROcodone-acetaminophen (NORCO) 10-325 MG per tablet Take 1 tablet by mouth every 6 (six) hours as needed for moderate pain.    Yes Historical Provider, MD  ibuprofen (ADVIL,MOTRIN) 200 MG tablet Take 200 mg by mouth every 6 (six) hours as needed for moderate pain.    Historical Provider, MD  Omega-3 Fatty Acids (FISH OIL PO) Take 1 tablet by mouth daily.    Historical Provider, MD     Review of Systems  Positive ROS: As above  All other systems have been reviewed and were otherwise  negative with the exception of those mentioned in the HPI and as above.  Objective: Vital signs in last 24 hours: Temp:  [97.8 F (36.6 C)] 97.8 F (36.6 C) (04/20 0615) Pulse Rate:  [69] 69 (04/20 0615) Resp:  [20] 20 (04/20 0615) BP: (122)/(72) 122/72 mmHg (04/20 0615) SpO2:  [98 %] 98 % (04/20 0615) Weight:  [108.863 kg (240 lb)] 108.863 kg (240 lb) (04/20 0615)  General Appearance: Alert, cooperative, no distress, Head: Normocephalic, without obvious abnormality, atraumatic Eyes: PERRL, conjunctiva/corneas clear, EOM's intact,    Ears: Normal  Throat: Normal  Neck: Supple, symmetrical, trachea midline, no adenopathy; thyroid: No enlargement/tenderness/nodules; no carotid bruit or JVD Back: Symmetric, no curvature, ROM normal, no CVA tenderness Lungs: Clear to auscultation bilaterally, respirations unlabored Heart: Regular rate and rhythm, no murmur, rub or gallop Abdomen: Soft, non-tender,, no masses, no organomegaly Extremities: Extremities normal, atraumatic, no cyanosis or edema Pulses: 2+ and symmetric all extremities Skin: Skin color, texture, turgor normal, no rashes or lesions  NEUROLOGIC:   Mental status: alert and oriented, no aphasia, good attention span, Fund of knowledge/ memory ok Motor Exam - grossly normal Sensory Exam - grossly normal Reflexes:  Coordination - grossly normal Gait - grossly normal Balance - grossly normal Cranial Nerves: I: smell Not tested  II: visual acuity  OS: Normal  OD: Normal   II: visual fields Full to confrontation  II: pupils  Equal, round, reactive to light  III,VII: ptosis None  III,IV,VI: extraocular muscles  Full ROM  V: mastication Normal  V: facial light touch sensation  Normal  V,VII: corneal reflex  Present  VII: facial muscle function - upper  Normal  VII: facial muscle function - lower Normal  VIII: hearing Not tested  IX: soft palate elevation  Normal  IX,X: gag reflex Present  XI: trapezius strength  5/5  XI:  sternocleidomastoid strength 5/5  XI: neck flexion strength  5/5  XII: tongue strength  Normal    Data Review Lab Results  Component Value Date   WBC 5.2 07/29/2013   HGB 13.4 07/29/2013   HCT 40.3 07/29/2013   MCV 92.6 07/29/2013   PLT 333 07/29/2013   Lab Results  Component Value Date   NA 145 07/29/2013   K 3.8 07/29/2013   CL 107 07/29/2013   CO2 23 07/29/2013   BUN 15 07/29/2013   CREATININE 0.63 07/29/2013   GLUCOSE 96 07/29/2013   No results found for this basename: INR, PROTIME    Assessment/Plan: L4-5 spondylolisthesis, spinal stenosis, lumbago, lumbar radiculopathy, neurogenic claudication: I discussed the situation with the patient. I have reviewed her imaging studies with her and pointed out the abnormalities. We have discussed the various treatment options including surgery. I described the surgical treatment option of an L4-5 decompression, instrumentation, and fusion. I have shown her surgical models. We have discussed the risks, benefits, alternatives, and likelihood of achieving our goals with surgery. I've answered all the patient's questions. She has decided proceed with surgery.   Cristi LoronJeffrey D Ezreal Turay 08/08/2013 7:19 AM

## 2013-08-09 LAB — URINALYSIS, ROUTINE W REFLEX MICROSCOPIC
BILIRUBIN URINE: NEGATIVE
Glucose, UA: NEGATIVE mg/dL
HGB URINE DIPSTICK: NEGATIVE
Ketones, ur: NEGATIVE mg/dL
Leukocytes, UA: NEGATIVE
NITRITE: NEGATIVE
PROTEIN: NEGATIVE mg/dL
SPECIFIC GRAVITY, URINE: 1.008 (ref 1.005–1.030)
UROBILINOGEN UA: 0.2 mg/dL (ref 0.0–1.0)
pH: 7.5 (ref 5.0–8.0)

## 2013-08-09 LAB — BASIC METABOLIC PANEL
BUN: 6 mg/dL (ref 6–23)
CHLORIDE: 106 meq/L (ref 96–112)
CO2: 23 mEq/L (ref 19–32)
Calcium: 8.7 mg/dL (ref 8.4–10.5)
Creatinine, Ser: 0.63 mg/dL (ref 0.50–1.10)
Glucose, Bld: 99 mg/dL (ref 70–99)
Potassium: 3.7 mEq/L (ref 3.7–5.3)
Sodium: 142 mEq/L (ref 137–147)

## 2013-08-09 LAB — CBC
HEMATOCRIT: 31.5 % — AB (ref 36.0–46.0)
Hemoglobin: 10.4 g/dL — ABNORMAL LOW (ref 12.0–15.0)
MCH: 30.5 pg (ref 26.0–34.0)
MCHC: 33 g/dL (ref 30.0–36.0)
MCV: 92.4 fL (ref 78.0–100.0)
PLATELETS: 322 10*3/uL (ref 150–400)
RBC: 3.41 MIL/uL — ABNORMAL LOW (ref 3.87–5.11)
RDW: 12.9 % (ref 11.5–15.5)
WBC: 12.8 10*3/uL — AB (ref 4.0–10.5)

## 2013-08-09 LAB — GLUCOSE, CAPILLARY: GLUCOSE-CAPILLARY: 111 mg/dL — AB (ref 70–99)

## 2013-08-09 MED ORDER — ALPRAZOLAM 0.5 MG PO TABS
0.5000 mg | ORAL_TABLET | Freq: Four times a day (QID) | ORAL | Status: DC | PRN
  Administered 2013-08-10 – 2013-08-11 (×4): 0.5 mg via ORAL
  Filled 2013-08-09 (×4): qty 1

## 2013-08-09 MED ORDER — POLYETHYLENE GLYCOL 3350 17 G PO PACK: 17.0000 g | PACK | Freq: Every day | ORAL | Status: DC

## 2013-08-09 MED ORDER — POLYETHYLENE GLYCOL 3350 17 G PO PACK
17.0000 g | PACK | Freq: Every day | ORAL | Status: DC
  Administered 2013-08-10 – 2013-08-11 (×2): 17 g via ORAL
  Filled 2013-08-09 (×3): qty 1

## 2013-08-09 MED FILL — Sodium Chloride IV Soln 0.9%: INTRAVENOUS | Qty: 2000 | Status: AC

## 2013-08-09 MED FILL — Heparin Sodium (Porcine) Inj 1000 Unit/ML: INTRAMUSCULAR | Qty: 30 | Status: AC

## 2013-08-09 NOTE — Evaluation (Signed)
Physical Therapy Evaluation Patient Details Name: Ariel Fisher MRN: [id number] DOB: [birth year] Today's Date: 08/09/2013   History of Present Illness  68 y.o. s/p POSTERIOR LUMBAR FUSION 1 LEVEL (N/A) - L45 laminectomy with posterior lumbar interbody fusion with interbody prosthesis posterior lateral arthrodesis and posterior nonsegmental instrumentation  Clinical Impression  Pt admitted with/for lumbar fusion.  Pt currently limited functionally due to the problems listed below.  (see problems list.)  Pt will benefit from PT to maximize function and safety to be able to get home safely with available assist of family.     Follow Up Recommendations No PT follow up;Supervision for mobility/OOB    Equipment Recommendations  3in1 (PT)    Recommendations for Other Services       Precautions / Restrictions Precautions Precautions: Back;Fall Precaution Booklet Issued: Yes (comment) Precaution Comments: Educated on precautions. Required Braces or Orthoses: Spinal Brace Spinal Brace: Lumbar corset;Applied in sitting position Restrictions Weight Bearing Restrictions: No      Mobility  Bed Mobility Overal bed mobility: Needs Assistance Bed Mobility: Rolling;Sit to Sidelying;Sidelying to Sit Rolling: Min guard (no rail) Sidelying to sit: Min guard     Sit to sidelying: Min guard General bed mobility comments: Cues/demo for technique.  Transfers Overall transfer level: Needs assistance Equipment used: Rolling walker (2 wheeled) Transfers: Sit to/from Stand Sit to Stand: Min guard         General transfer comment: Cues for hand placement/technique.  Ambulation/Gait Ambulation/Gait assistance: Min guard Ambulation Distance (Feet): 200 Feet Assistive device: Rolling walker (2 wheeled) Gait Pattern/deviations: Step-through pattern Gait velocity: slow, anxious about increasing speed   General Gait Details: mildly unsteady at times needing RW.  Tends to wander L/R and wobble  with scanning.  Stairs            Wheelchair Mobility    Modified Rankin (Stroke Patients Only)       Balance Overall balance assessment: Needs assistance Sitting-balance support: Feet supported Sitting balance-Leahy Scale: Good     Standing balance support: No upper extremity supported Standing balance-Leahy Scale: Fair                               Pertinent Vitals/Pain     Home Living Family/patient expects to be discharged to:: Private residence Living Arrangements: Spouse/significant other Available Help at Discharge: Family;Available 24 hours/day Type of Home: Apartment (lives in multi-level house; going to stay in apartment) Home Access: Level entry     Home Layout: One level Home Equipment: Environmental consultantWalker - 2 wheels      Prior Function Level of Independence: Independent               Hand Dominance   Dominant Hand: Right    Extremity/Trunk Assessment   Upper Extremity Assessment: Defer to OT evaluation           Lower Extremity Assessment: Overall WFL for tasks assessed;Generalized weakness         Communication   Communication: No difficulties  Cognition Arousal/Alertness: Awake/alert Behavior During Therapy: WFL for tasks assessed/performed Overall Cognitive Status: Within Functional Limits for tasks assessed                      General Comments      Exercises        Assessment/Plan    PT Assessment Patient needs continued PT services  PT Diagnosis     PT Problem List  Decreased strength;Decreased activity tolerance;Decreased mobility;Decreased knowledge of precautions;Decreased knowledge of use of DME  PT Treatment Interventions DME instruction;Gait training;Stair training;Functional mobility training;Therapeutic activities;Patient/family education   PT Goals (Current goals can be found in the Care Plan section) Acute Rehab PT Goals Patient Stated Goal: go home PT Goal Formulation: With patient Time  For Goal Achievement: 08/16/13 Potential to Achieve Goals: Good    Frequency Min 5X/week   Barriers to discharge        Co-evaluation               End of Session Equipment Utilized During Treatment: Back brace Activity Tolerance: Patient tolerated treatment well Patient left: in bed;with call bell/phone within reach;with family/visitor present Nurse Communication: Mobility status         Time: 1130-1158 PT Time Calculation (min): 28 min   Charges:   PT Evaluation $Initial PT Evaluation Tier I: 1 Procedure PT Treatments $Gait Training: 8-22 mins   PT G CodesEliseo Fisher:          Ariel Fisher 08/09/2013, 12:14 PM 08/09/2013  Ariel Fisher, PT [phone]  (pager)

## 2013-08-09 NOTE — Progress Notes (Signed)
Pt continually urinating once an hour about 150cc. Urinalysis ordered to check for UTI. Pt requests to check for UTI. Will continue to monitor.

## 2013-08-09 NOTE — Progress Notes (Signed)
Patient ID: Ariel Fisher, female   DOB: 1945/08/24, 68 y.o.   MRN: [id number] Subjective:  the patient is alert and pleasant. She looks well. Her back is appropriately sore.  Objective: Vital signs in last 24 hours: Temp:  [96.8 F (36 C)-98.6 F (37 C)] 98.4 F (36.9 C) (04/21 0618) Pulse Rate:  [61-95] 74 (04/21 0618) Resp:  [10-20] 18 (04/21 0618) BP: (105-127)/(65-93) 105/69 mmHg (04/21 0618) SpO2:  [94 %-100 %] 98 % (04/21 0618)  Intake/Output from previous day: 04/20 0701 - 04/21 0700 In: 2700 [I.V.:2700] Out: 4180 [Urine:3600; Drains:180; Blood:400] Intake/Output this shift:    Physical exam the patient is alert and oriented. Her strength is grossly normal in her lower extremities.  Lab Results:  Recent Labs  08/09/13 0518  WBC 12.8*  HGB 10.4*  HCT 31.5*  PLT 322   BMET  Recent Labs  08/09/13 0518  NA 142  K 3.7  CL 106  CO2 23  GLUCOSE 99  BUN 6  CREATININE 0.63  CALCIUM 8.7    Studies/Results: Dg Lumbar Spine 2-3 Views  08/08/2013   CLINICAL DATA:  Lumbar fusion.  EXAM: LUMBAR SPINE - 2-3 VIEW; DG C-ARM 1-60 MIN  COMPARISON:  Intraoperative radiograph same date. Myelogram CT 03/18/2013.  FINDINGS: Two spot fluoroscopic images of the lower lumbar spine demonstrate interval laminectomy and PLIF at L4-5. The bilateral pedicle screws and interbody spacer appear well positioned. The interconnecting rods have not yet been placed. No complications are identified.  IMPRESSION: Intraoperative views during L4-5 PLIF.  No demonstrated complicated.   Electronically Signed   By: Roxy HorsemanBill  Veazey M.D.   On: 08/08/2013 15:38   Dg Lumbar Spine 2-3 Views  08/08/2013   CLINICAL DATA:  Lumbar fusion.  EXAM: LUMBAR SPINE - 2-3 VIEW  COMPARISON:  Send CT 03/18/2013  FINDINGS: First lateral intraoperative image demonstrates posterior surgical instruments directed at the L5-S1 level.  Second lateral intraoperative image demonstrates posterior surgical instruments directed at the  L5 pedicles.  IMPRESSION: Intraoperative localization as above.   Electronically Signed   By: Charlett NoseKevin  Dover M.D.   On: 08/08/2013 12:56   Dg C-arm 1-60 Min  08/08/2013   CLINICAL DATA:  Lumbar fusion.  EXAM: LUMBAR SPINE - 2-3 VIEW; DG C-ARM 1-60 MIN  COMPARISON:  Intraoperative radiograph same date. Myelogram CT 03/18/2013.  FINDINGS: Two spot fluoroscopic images of the lower lumbar spine demonstrate interval laminectomy and PLIF at L4-5. The bilateral pedicle screws and interbody spacer appear well positioned. The interconnecting rods have not yet been placed. No complications are identified.  IMPRESSION: Intraoperative views during L4-5 PLIF.  No demonstrated complicated.   Electronically Signed   By: Roxy HorsemanBill  Veazey M.D.   On: 08/08/2013 15:38    Assessment/Plan: Postop day 1: The patient is doing well. We will discontinue her drain and mobilize her.   LOS: 1 day     Cristi LoronJeffrey D Seydou Hearns 08/09/2013, 7:40 AM

## 2013-08-09 NOTE — Progress Notes (Signed)
Hemovac to lower back incision d/c'd per MD's order. Patient tolerated well.

## 2013-08-09 NOTE — Evaluation (Signed)
Occupational Therapy Evaluation Patient Details Name: Ariel Fisher MRN: [id number] DOB: [birth year] Today's Date: 08/09/2013    History of Present Illness 68 y.o. s/p POSTERIOR LUMBAR FUSION 1 LEVEL (N/A) - L45 laminectomy with posterior lumbar interbody fusion with interbody prosthesis posterior lateral arthrodesis and posterior nonsegmental instrumentation   Clinical Impression   Pt s/p above surgery. Pt moving well during session and education provided. Feel pt will benefit from acute OT to increase independence prior to d/c.    Follow Up Recommendations  No OT follow up;Supervision - Intermittent (when OOB/mobility)    Equipment Recommendations  3 in 1 bedside comode    Recommendations for Other Services       Precautions / Restrictions Precautions Precautions: Back;Fall Precaution Booklet Issued: Yes (comment) Precaution Comments: Educated on precautions. Required Braces or Orthoses: Spinal Brace Spinal Brace: Lumbar corset;Applied in sitting position Restrictions Weight Bearing Restrictions: No      Mobility Bed Mobility Overal bed mobility: Needs Assistance Bed Mobility: Rolling;Sidelying to Sit Rolling: Min guard Sidelying to sit: Min guard       General bed mobility comments: Cues for technique.  Transfers Overall transfer level: Needs assistance Equipment used: Rolling walker (2 wheeled) Transfers: Sit to/from Stand Sit to Stand: Min guard         General transfer comment: Cues for hand placement/technique.    Balance                                            ADL Overall ADL's : Needs assistance/impaired                 Upper Body Dressing : Minimal assistance;Sitting (back brace)   Lower Body Dressing: Min guard;Sit to/from stand   Toilet Transfer: Min guard;Ambulation;RW (chair)           Functional mobility during ADLs: Min guard;Rolling walker General ADL Comments: Educated on use of cup for teeth care and  placement of grooming items to avoid breaking precautions. Pt able to cross legs for LB dressing-pt interested in AE kit, so OT educated on AE. OT educated on toilet aid if pt would need this for hygiene (someone has been helping her in hospital with hygiene). Educated on use of bag on walker to carry items.     Vision                     Perception     Praxis      Pertinent Vitals/Pain Pain 5/10. Increased activity during session. Pillow behind pt's back in chair at end of session.     Hand Dominance Right   Extremity/Trunk Assessment Upper Extremity Assessment Upper Extremity Assessment: Overall WFL for tasks assessed   Lower Extremity Assessment Lower Extremity Assessment: Defer to PT evaluation       Communication Communication Communication: No difficulties   Cognition Arousal/Alertness: Awake/alert Behavior During Therapy: WFL for tasks assessed/performed Overall Cognitive Status: Within Functional Limits for tasks assessed                     General Comments       Exercises       Shoulder Instructions      Home Living Family/patient expects to be discharged to:: Private residence Living Arrangements: Spouse/significant other Available Help at Discharge: Family;Available 24 hours/day Type of Home: Apartment (lives in multi-level house;  going to stay in apartment) Home Access: Level entry     Home Layout: One level     Bathroom Shower/Tub: Tub/shower unit (walk in at home; tub/shower at apartment)   Bathroom Toilet: Standard     Home Equipment: Walker - 2 wheels          Prior Functioning/Environment Level of Independence: Independent             OT Diagnosis: Acute pain   OT Problem List: Decreased strength;Decreased activity tolerance;Decreased knowledge of use of DME or AE;Decreased knowledge of precautions;Pain   OT Treatment/Interventions: Self-care/ADL training;DME and/or AE instruction;Therapeutic  activities;Patient/family education;Balance training    OT Goals(Current goals can be found in the care plan section) Acute Rehab OT Goals Patient Stated Goal: go home OT Goal Formulation: With patient Time For Goal Achievement: 08/16/13 Potential to Achieve Goals: Good ADL Goals Pt Will Perform Grooming: with modified independence;standing Pt Will Perform Lower Body Dressing: with modified independence;sit to/from stand Pt Will Transfer to Toilet: with modified independence;ambulating (3 in 1 over commode) Pt Will Perform Toileting - Clothing Manipulation and hygiene: with modified independence;sit to/from stand Pt Will Perform Tub/Shower Transfer: Tub transfer;with supervision;ambulating;rolling walker (shower DME tbd) Additional ADL Goal #1: Pt will independently verbalize 3/3 back precautions.    OT Frequency: Min 2X/week   Barriers to D/C:            Co-evaluation              End of Session Equipment Utilized During Treatment: Gait belt;Rolling walker;Back brace  Activity Tolerance: Patient tolerated treatment well Patient left: in chair;with call bell/phone within reach;with family/visitor present   Time: 0929-0959 OT Time Calculation (min): 30 min Charges:  OT General Charges $OT Visit: 1 Procedure OT Evaluation $Initial OT Evaluation Tier I: 1 Procedure OT Treatments $Self Care/Home Management : 8-22 mins G-Codes:    Lindsey L Straub OTR/L [phone] 08/09/2013, 10:12 AM    

## 2013-08-09 NOTE — Progress Notes (Signed)
UR complete.  Daquisha Clermont RN, MSN 

## 2013-08-10 MED ORDER — BISACODYL 10 MG RE SUPP: 10.0000 mg | Freq: Every day | RECTAL | Status: DC | PRN

## 2013-08-10 NOTE — Progress Notes (Signed)
Occupational Therapy Treatment Patient Details Name: Ariel Fisher MRN: [id number] DOB: [birth year] Today's Date: 08/10/2013    History of present illness 68 y.o. s/p POSTERIOR LUMBAR FUSION 1 LEVEL (N/A) - L45 laminectomy with posterior lumbar interbody fusion with interbody prosthesis posterior lateral arthrodesis and posterior nonsegmental instrumentation   OT comments  Pt not feeling well today, but willing to participate in therapy. Performed ADLs and ambulated in hallway. Pt became dizzy and nauseous in hallway, so rolled pt back to room.   Follow Up Recommendations  No OT follow up;Supervision - Intermittent (when OOB/mobility)    Equipment Recommendations  3 in 1 bedside comode    Recommendations for Other Services      Precautions / Restrictions Precautions Precautions: Back;Fall Precaution Comments: Pt able to state 3/3 back precautions. Required Braces or Orthoses: Spinal Brace Spinal Brace: Lumbar corset;Applied in sitting position Restrictions Weight Bearing Restrictions: No       Mobility Bed Mobility Overal bed mobility: Needs Assistance Bed Mobility: Rolling;Sidelying to Sit;Sit to Sidelying Rolling: Min assist Sidelying to sit: Mod assist     Sit to sidelying: Min guard General bed mobility comments: Assisted with trunk when going to sitting position and assisted with rolling onto back.   Transfers Overall transfer level: Needs assistance Equipment used: Rolling walker (2 wheeled) Transfers: Sit to/from Stand Sit to Stand: Min guard;Min assist         General transfer comment: cues for hand placement.    Balance                                   ADL Overall ADL's : Needs assistance/impaired     Grooming: Oral care;Set up;Supervision/safety;Standing;Min guard   Upper Body Bathing: Set up;Supervision/ safety;Sitting   Lower Body Bathing: Minimal assistance;Sit to/from stand   Upper Body Dressing : Minimal  assistance;Sitting;Moderate assistance       Toilet Transfer: Min guard;Ambulation;RW (3 in 1 over commode)   Toileting- Clothing Manipulation and Hygiene: Min guard;Minimal assistance (Min guard for clothing; washed peri area Min A)       Functional mobility during ADLs: Min guard;Rolling walker General ADL Comments: Pt performed bathing and dressing during session. Pt also ambulated in hallway. Reviewed information with pt (use of cup, placement of grooming items). OT dofffed back brace for pt when she returned back to bed due to pt not feeling well.       Vision                     Perception     Praxis      Cognition   Behavior During Therapy: [facility] for tasks assessed/performed Overall Cognitive Status: Within Functional Limits for tasks assessed                       Extremity/Trunk Assessment               Exercises     Shoulder Instructions       General Comments      Pertinent Vitals/ Pain       Pain 6/10. Repositioned.   Home Living                                          Prior Functioning/Environment  Frequency Min 2X/week     Progress Toward Goals  OT Goals(current goals can now be found in the care plan section)  Progress towards OT goals: Progressing toward goals  Acute Rehab OT Goals Patient Stated Goal: go home OT Goal Formulation: With patient Time For Goal Achievement: 08/16/13 Potential to Achieve Goals: Good ADL Goals Pt Will Perform Grooming: with modified independence;standing Pt Will Perform Lower Body Dressing: with modified independence;sit to/from stand Pt Will Transfer to Toilet: with modified independence;ambulating (3 in 1 over commode) Pt Will Perform Toileting - Clothing Manipulation and hygiene: with modified independence;sit to/from stand Pt Will Perform Tub/Shower Transfer: Tub transfer;with supervision;ambulating;rolling walker (shower DME tbd) Additional ADL Goal  #1: Pt will independently verbalize 3/3 back precautions.    Plan Discharge plan remains appropriate    Co-evaluation                 End of Session Equipment Utilized During Treatment: Gait belt;Rolling walker;Back brace   Activity Tolerance Patient limited by fatigue;Other (comment) (became nauseous/dizzy)   Patient Left in bed;with call bell/phone within reach;with family/visitor present;Other (comment) (tech in room)   Nurse Communication Other (comment) (a nurse assisted in getting pt back to room)        Time: [id number] OT Time Calculation (min): 30 min  Charges: OT General Charges $OT Visit: 1 Procedure OT Treatments $Self Care/Home Management : 8-22 mins $Therapeutic Activity: 8-22 mins  Earlie RavelingLindsey L Sugar Vanzandt OTR/L [phone] 08/10/2013, 12:00 PM

## 2013-08-10 NOTE — Progress Notes (Signed)
Patient ID: Ariel Fisher, female   DOB: 22-Oct-1945, 68 y.o.   MRN: [id number] Subjective:  The patient is alert and pleasant. She looks well. She feels a bit better.  Objective: Vital signs in last 24 hours: Temp:  [98.6 F (37 C)-99.6 F (37.6 C)] 98.6 F (37 C) (04/22 1040) Pulse Rate:  [96-98] 98 (04/22 1040) Resp:  [16-20] 20 (04/22 1040) BP: (94-124)/(59-82) 120/64 mmHg (04/22 1117) SpO2:  [95 %-99 %] 95 % (04/22 1040)  Intake/Output from previous day: 04/21 0701 - 04/22 0700 In: 360 [P.O.:360] Out: -  Intake/Output this shift:    Physical exam patient is alert and oriented. Her lower extremity strength is grossly normal.  Lab Results:  Recent Labs  08/09/13 0518  WBC 12.8*  HGB 10.4*  HCT 31.5*  PLT 322   BMET  Recent Labs  08/09/13 0518  NA 142  K 3.7  CL 106  CO2 23  GLUCOSE 99  BUN 6  CREATININE 0.63  CALCIUM 8.7    Studies/Results: No results found.  Assessment/Plan: Postop day #2: The patient is doing well. We will continue to mobilize her. She may go home tomorrow.  LOS: 2 days     Cristi LoronJeffrey D Seerat Peaden 08/10/2013, 12:37 PM

## 2013-08-10 NOTE — Progress Notes (Signed)
Physical Therapy Treatment Patient Details Name: Ariel Fisher MRN: [id number] DOB: May 10, 1945 Today's Date: 08/10/2013    History of Present Illness 68 y.o. s/p POSTERIOR LUMBAR FUSION 1 LEVEL (N/A) - L45 laminectomy with posterior lumbar interbody fusion with interbody prosthesis posterior lateral arthrodesis and posterior nonsegmental instrumentation    PT Comments    Much more energetic this afternoon.  Progressing well.  Reinforced all back education and pt verbalized understanding.  Follow Up Recommendations  No PT follow up;Supervision for mobility/OOB     Equipment Recommendations  3in1 (PT) (maybe a tub seat)    Recommendations for Other Services       Precautions / Restrictions Precautions Precautions: Back;Fall Precaution Comments: Pt able to state 3/3 back precautions. Required Braces or Orthoses: Spinal Brace Spinal Brace: Lumbar corset;Applied in sitting position Restrictions Weight Bearing Restrictions: No    Mobility  Bed Mobility Overal bed mobility: Needs Assistance                Transfers Overall transfer level: Needs assistance Equipment used: Rolling walker (2 wheeled) Transfers: Sit to/from Stand Sit to Stand: Min assist         General transfer comment: cues for hand placement. minimal assist for lower surface.  Ambulation/Gait Ambulation/Gait assistance: Min guard Ambulation Distance (Feet): 280 Feet Assistive device: Rolling walker (2 wheeled) Gait Pattern/deviations: Step-through pattern Gait velocity: prefers slower, but can increase speed minimally   General Gait Details: generally steady   Stairs            Wheelchair Mobility    Modified Rankin (Stroke Patients Only)       Balance Overall balance assessment: Needs assistance Sitting-balance support: Feet supported;No upper extremity supported Sitting balance-Leahy Scale: Good     Standing balance support: No upper extremity supported Standing  balance-Leahy Scale: Fair                      Cognition Arousal/Alertness: Awake/alert Behavior During Therapy: WFL for tasks assessed/performed Overall Cognitive Status: Within Functional Limits for tasks assessed                      Exercises      General Comments        Pertinent Vitals/Pain     Home Living                      Prior Function            PT Goals (current goals can now be found in the care plan section) Acute Rehab PT Goals Patient Stated Goal: go home PT Goal Formulation: With patient Time For Goal Achievement: 08/16/13 Potential to Achieve Goals: Good Progress towards PT goals: Progressing toward goals    Frequency  Min 5X/week    PT Plan      Co-evaluation             End of Session Equipment Utilized During Treatment: Back brace Activity Tolerance: Patient tolerated treatment well Patient left: in chair;with call bell/phone within reach;with bed alarm set;with family/visitor present     Time: 4098-11911625-1652 PT Time Calculation (min): 27 min  Charges:  $Gait Training: 8-22 mins $Self Care/Home Management: 8-22                    G CodesEliseo Gum:      Rhiley Solem V Markee Remlinger 08/10/2013, 5:40 PM 08/10/2013  [place] BingKen Javares Kaufhold, PT [phone]  (pager)

## 2013-08-11 MED ORDER — OXYCODONE HCL ER 10 MG PO T12A
20.0000 mg | EXTENDED_RELEASE_TABLET | Freq: Two times a day (BID) | ORAL | Status: DC
  Administered 2013-08-11 – 2013-08-12 (×3): 20 mg via ORAL
  Filled 2013-08-11 (×3): qty 2

## 2013-08-11 NOTE — Progress Notes (Signed)
Physical Therapy Treatment Patient Details Name: Ariel Fisher MRN: [id number] DOB: [birth year] Today's Date: 08/11/2013    History of Present Illness 68 y.o. s/p POSTERIOR LUMBAR FUSION 1 LEVEL (N/A) - L45 laminectomy with posterior lumbar interbody fusion with interbody prosthesis posterior lateral arthrodesis and posterior nonsegmental instrumentation    PT Comments    Progressing well.  All education completed. Questions answered satisfactorily per pt.  Follow Up Recommendations  No PT follow up;Supervision for mobility/OOB     Equipment Recommendations  3in1 (PT) (maybe a tub seat)    Recommendations for Other Services       Precautions / Restrictions Precautions Precautions: Back;Fall Precaution Comments: Pt able to state 3/3 back precautions. Required Braces or Orthoses: Spinal Brace Spinal Brace: Lumbar corset;Applied in sitting position Restrictions Weight Bearing Restrictions: No    Mobility  Bed Mobility Overal bed mobility: Needs Assistance Bed Mobility: Rolling;Sidelying to Sit Rolling: Supervision Sidelying to sit: Supervision (without rail)       General bed mobility comments: reinforced best technique  Transfers Overall transfer level: Needs assistance Equipment used: Rolling walker (2 wheeled) Transfers: Sit to/from Stand Sit to Stand: Min guard         General transfer comment: cues for hand placement. minimal guard assist for bed and off toilet  Ambulation/Gait Ambulation/Gait assistance: Min guard Ambulation Distance (Feet): 600 Feet Assistive device: Rolling walker (2 wheeled) Gait Pattern/deviations: Step-through pattern Gait velocity: able to increase sped toward normal age appropriate speeds Gait velocity interpretation: at or above normal speed for age/gender General Gait Details: generally steady   Stairs Stairs: Yes   Stair Management: One rail Right;Alternating pattern;Step to pattern;Forwards Number of Stairs: 5 General  stair comments: steady and safe with a rail  Wheelchair Mobility    Modified Rankin (Stroke Patients Only)       Balance Overall balance assessment: No apparent balance deficits (not formally assessed)   Sitting balance-Leahy Scale: Good       Standing balance-Leahy Scale: Fair Standing balance comment: no signs of instabiliy walking with RW.                    Cognition Arousal/Alertness: Awake/alert Behavior During Therapy: WFL for tasks assessed/performed Overall Cognitive Status: Within Functional Limits for tasks assessed                      Exercises      General Comments        Pertinent Vitals/Pain     Home Living                      Prior Function            PT Goals (current goals can now be found in the care plan section) Acute Rehab PT Goals Patient Stated Goal: go home PT Goal Formulation: With patient Time For Goal Achievement: 08/16/13 Potential to Achieve Goals: Good Progress towards PT goals: Progressing toward goals    Frequency  Min 5X/week    PT Plan Current plan remains appropriate    Co-evaluation             End of Session Equipment Utilized During Treatment: Back brace Activity Tolerance: Patient tolerated treatment well Patient left: in chair;with call bell/phone within reach;with family/visitor present     Time: 7829-56211122-1145 PT Time Calculation (min): 23 min  Charges:  $Gait Training: 8-22 mins $Self Care/Home Management: 8-22  G CodesEliseo Fisher:      Ariel Fisher 08/11/2013, 1:25 PM 08/11/2013  [place] BingKen Keyon Fisher, PT [phone]  (pager)

## 2013-08-11 NOTE — Progress Notes (Signed)
Patient ID: Ariel Fisher, female   DOB: 01-20-1946, 68 y.o.   MRN: [id number] Subjective:  The patient is alert and pleasant. She wants to go home tomorrow.  Objective: Vital signs in last 24 hours: Temp:  [98.4 F (36.9 C)-100.4 F (38 C)] 99.2 F (37.3 C) (04/23 0516) Pulse Rate:  [89-98] 89 (04/23 0516) Resp:  [20] 20 (04/23 0516) BP: (94-120)/(64-73) 110/73 mmHg (04/23 0516) SpO2:  [94 %-98 %] 96 % (04/23 0516)  Intake/Output from previous day:   Intake/Output this shift: Total I/O In: 480 [P.O.:480] Out: -   Physical exam patient is alert and oriented. Her strength is grossly normal. Her wound is healing well without signs of infection.  Lab Results:  Recent Labs  08/09/13 0518  WBC 12.8*  HGB 10.4*  HCT 31.5*  PLT 322   BMET  Recent Labs  08/09/13 0518  NA 142  K 3.7  CL 106  CO2 23  GLUCOSE 99  BUN 6  CREATININE 0.63  CALCIUM 8.7    Studies/Results: No results found.  Assessment/Plan: Postop day #3: The patient is doing well. She will likely go home tomorrow.  LOS: 3 days     Cristi LoronJeffrey D Maribelle Hopple 08/11/2013, 9:31 AM

## 2013-08-11 NOTE — Progress Notes (Signed)
Occupational Therapy Treatment and Discharge Patient Details Name: Ariel Fisher MRN: [id number] DOB: 03/09/1946 Today's Date: 08/11/2013    History of present illness 68 y.o. s/p POSTERIOR LUMBAR FUSION 1 LEVEL (N/A) - L45 laminectomy with posterior lumbar interbody fusion with interbody prosthesis posterior lateral arthrodesis and posterior nonsegmental instrumentation   OT comments  This 68 yo female presents to acute OT with all education completed and all questions answered, will D/C from acute OT.  Follow Up Recommendations  No OT follow up;Supervision - Intermittent    Equipment Recommendations  3 in 1 bedside comode       Precautions / Restrictions Precautions Precautions: Back;Fall Precaution Comments: Pt able to state 3/3 back precautions.              ADL                                         General ADL Comments: Pt is going to get a 3n1 (but not tub equipment due to this is an out of pocket cost) so I should her and her god-daughter a picture sequence of how she could use her 3n1 in tub as a seat. Pt reports that she going to get the AE kit and toilet aid from the gift shop. Pt aware to use wet wipes with toliet aid for ease of hygiene. Pt without further questions about BADLs.                 Cognition   Behavior During Therapy: WFL for tasks assessed/performed Overall Cognitive Status: Within Functional Limits for tasks assessed                                             Frequency Min 2X/week     Progress Toward Goals  OT Goals(current goals can now be found in the care plan section)  Progress towards OT goals:  (All education completed.)     Plan Discharge plan remains appropriate             Patient Left in bed           Time: 1455-1514 OT Time Calculation (min): 19 min  Charges: OT General Charges $OT Visit: 1 Procedure OT Treatments $Self Care/Home Management : 8-22 mins  Almon Register [phone] 08/11/2013, 7:07 PM

## 2013-08-11 NOTE — Progress Notes (Signed)
Patient ID: Ariel Fisher, female   DOB: [birth year], 68 y.o.   MRN: [id number] I forgot to mention that the patient states that her pain medication is not lasting long enough". I will add OxyContin.

## 2013-08-12 MED ORDER — OXYCODONE HCL ER 20 MG PO T12A: 20.0000 mg | EXTENDED_RELEASE_TABLET | Freq: Two times a day (BID) | ORAL | Status: DC

## 2013-08-12 MED ORDER — DSS 100 MG PO CAPS: 100.0000 mg | ORAL_CAPSULE | Freq: Two times a day (BID) | ORAL | Status: DC

## 2013-08-12 MED ORDER — DIAZEPAM 5 MG PO TABS: 5.0000 mg | ORAL_TABLET | Freq: Four times a day (QID) | ORAL | Status: DC | PRN

## 2013-08-12 MED ORDER — OXYCODONE-ACETAMINOPHEN 10-325 MG PO TABS: 1.0000 | ORAL_TABLET | ORAL | Status: DC | PRN

## 2013-08-12 NOTE — Discharge Summary (Signed)
Physician Discharge Summary  Patient ID: Ariel Fisher MRN: [id number] DOB/AGE: [birth year] 67 y.o.  Admit date: 08/08/2013 Discharge date: 08/12/2013  Admission Diagnoses: L4-5 spondylolisthesis, spinal stenosis, facet arthropathy, lumbago, lumbar radiculopathy, neurogenic claudication  Discharge Diagnoses: The same Active Problems:   Spondylolisthesis of lumbar region   Discharged Condition: good  Hospital Course: I performed an L4-5 decompression, instrumentation, and fusion on the patient on 08/08/2013. The surgery went well.  The patient's postoperative course was unremarkable. On postop day #4 she requested discharge to home. The patient, and her daughter, was given oral and written discharge instructions. All her questions were answered.  Consults: PT, OT Significant Diagnostic Studies: None Treatments: L4-5 decompression, each patient, and fusion. Discharge Exam: Blood pressure 106/68, pulse 94, temperature 98.1 F (36.7 C), temperature source Oral, resp. rate 20, height 5\' 7"  (1.702 m), weight 109.997 kg (242 lb 8 oz), SpO2 99.00%. The patient is alert and pleasant. Her strength is normal.  Disposition: Home  Discharge Orders   Future Orders Complete By Expires   Call MD for:  difficulty breathing, headache or visual disturbances  As directed    Call MD for:  extreme fatigue  As directed    Call MD for:  hives  As directed    Call MD for:  persistant dizziness or light-headedness  As directed    Call MD for:  persistant nausea and vomiting  As directed    Call MD for:  redness, tenderness, or signs of infection (pain, swelling, redness, odor or green/yellow discharge around incision site)  As directed    Call MD for:  severe uncontrolled pain  As directed    Call MD for:  temperature >100.4  As directed    Diet - low sodium heart healthy  As directed    Discharge instructions  As directed    Driving Restrictions  As directed    Increase activity slowly  As directed     Lifting restrictions  As directed    May shower / Bathe  As directed    No dressing needed  As directed        Medication List    STOP taking these medications       ALPRAZolam 0.5 MG tablet  Commonly known as:  XANAX     HYDROcodone-acetaminophen 10-325 MG per tablet  Commonly known as:  NORCO     ibuprofen 200 MG tablet  Commonly known as:  ADVIL,MOTRIN      TAKE these medications       amLODipine 2.5 MG tablet  Commonly known as:  NORVASC  Take 2.5 mg by mouth daily.     CINNAMON PO  Take 2 tablets by mouth daily.     diazepam 5 MG tablet  Commonly known as:  VALIUM  Take 1 tablet (5 mg total) by mouth every 6 (six) hours as needed for muscle spasms.     DSS 100 MG Caps  Take 100 mg by mouth 2 (two) times daily.     FISH OIL PO  Take 1 tablet by mouth daily.     FLAX SEED OIL PO  Take 1 tablet by mouth daily.     OxyCODONE 20 mg T12a 12 hr tablet  Commonly known as:  OXYCONTIN  Take 1 tablet (20 mg total) by mouth every 12 (twelve) hours.     oxyCODONE-acetaminophen 10-325 MG per tablet  Commonly known as:  PERCOCET  Take 1 tablet by mouth every 4 (four) hours as needed  for pain.         Signed: Cristi LoronJeffrey D Prisma Decarlo 08/12/2013, 7:47 AM

## 2013-08-12 NOTE — Progress Notes (Signed)
Pt. DC'd home via car with family.  DC instructions and prescriptions were given to patient.  Vital signs and assessments were stable.  

## 2015-03-09 ENCOUNTER — Other Ambulatory Visit: Payer: Self-pay | Admitting: Internal Medicine

## 2015-03-09 DIAGNOSIS — Z1231 Encounter for screening mammogram for malignant neoplasm of breast: Secondary | ICD-10-CM

## 2015-03-09 DIAGNOSIS — E2839 Other primary ovarian failure: Secondary | ICD-10-CM

## 2015-04-17 ENCOUNTER — Ambulatory Visit
Admission: RE | Admit: 2015-04-17 | Discharge: 2015-04-17 | Disposition: A | Discharge status: Home / Self Care | Payer: Medicare HMO | Source: Ambulatory Visit | Attending: Internal Medicine | Admitting: Internal Medicine

## 2015-04-17 DIAGNOSIS — Z1231 Encounter for screening mammogram for malignant neoplasm of breast: Secondary | ICD-10-CM

## 2015-04-17 DIAGNOSIS — E2839 Other primary ovarian failure: Secondary | ICD-10-CM

## 2016-05-27 ENCOUNTER — Other Ambulatory Visit: Payer: Self-pay | Admitting: Internal Medicine

## 2016-05-27 DIAGNOSIS — Z1231 Encounter for screening mammogram for malignant neoplasm of breast: Secondary | ICD-10-CM

## 2016-06-17 ENCOUNTER — Encounter (INDEPENDENT_AMBULATORY_CARE_PROVIDER_SITE_OTHER): Payer: Self-pay | Admitting: Physical Medicine and Rehabilitation

## 2016-06-17 ENCOUNTER — Telehealth (INDEPENDENT_AMBULATORY_CARE_PROVIDER_SITE_OTHER): Payer: Self-pay | Admitting: Physical Medicine and Rehabilitation

## 2016-06-17 ENCOUNTER — Ambulatory Visit (INDEPENDENT_AMBULATORY_CARE_PROVIDER_SITE_OTHER): Payer: Medicare HMO | Admitting: Physical Medicine and Rehabilitation

## 2016-06-17 VITALS — BP 148/93 | HR 83

## 2016-06-17 DIAGNOSIS — M47816 Spondylosis without myelopathy or radiculopathy, lumbar region: Secondary | ICD-10-CM

## 2016-06-17 DIAGNOSIS — M961 Postlaminectomy syndrome, not elsewhere classified: Secondary | ICD-10-CM | POA: Diagnosis not present

## 2016-06-17 DIAGNOSIS — G894 Chronic pain syndrome: Secondary | ICD-10-CM

## 2016-06-17 NOTE — Progress Notes (Signed)
Ariel Fisher - 71 y.o. female MRN [id number]  Date of birth: 1945-08-12  Office Visit Note: Visit Date: 06/17/2016 PCP: Alva Garnet., MD Referred by: No ref. provider found  Subjective: Chief Complaint  Patient presents with  . Lower Back - Pain  . Right Knee - Pain   HPI: Ariel Fisher is a 71 year old female that I have seen in the remote past, over 3 years ago. She has had lumbar fusion at L4-5 by Dr. Lovell Sheehan several years ago. She comes in today with history of chronic recalcitrant low back pain mainly at the lumbosacral junction more right than left but bilateral. She reports pain when she lies down flat and has difficulty sleeping. She also has a lot of pain with standing for any prolonged. She has trouble walking. She feels like she does get some referral pattern in the legs but no frank tingling or numbness. The left leg is been doing this ever since the surgery. She evidently saw Dr. Nickola Major for treatment and did have one injection performed per her report. She does not know exactly what the injection was but she did get a shot on both sides. Unfortunately we do not have her notes to review. She states it did help for around 3 months or so. She has not had radiofrequency ablation or any other procedures recently. She has had MRI performed at 2017 and this is reviewed below but basically shows adjacent level facet arthropathy with some foraminal narrowing but no central canal stenosis or herniated disc. She's had no new trauma or focal weakness. She states that she cannot see Dr. Nickola Major because of the cost. He is not had physical therapy recently. She does try to maintain activity level when she can. In the past she has been on a regimen of OxyContin and oxycodone which she is not taking at this point. She rates her pain is quite severe. She also reports problems with both knees. She's never had knee replacements. She has not had an orthopedic doctor evaluate her  knees.   Review of Systems  Constitutional: Negative for chills, fever, malaise/fatigue and weight loss.  HENT: Negative for hearing loss and sinus pain.   Eyes: Negative for blurred vision, double vision and photophobia.  Respiratory: Negative for cough and shortness of breath.   Cardiovascular: Negative for chest pain, palpitations and leg swelling.  Gastrointestinal: Negative for abdominal pain, nausea and vomiting.  Genitourinary: Negative for flank pain.  Musculoskeletal: Positive for back pain and joint pain. Negative for myalgias.  Skin: Negative for itching and rash.  Neurological: Negative for tremors, focal weakness and weakness.  Endo/Heme/Allergies: Negative.   Psychiatric/Behavioral: Negative for depression.  All other systems reviewed and are negative.  Otherwise per HPI.  Assessment & Plan: Visit Diagnoses:  1. Post laminectomy syndrome   2. Chronic pain syndrome   3. Spondylosis without myelopathy or radiculopathy, lumbar region     Plan: Findings:  Chronic worsening severe mostly axial low back pain with some referral down into the thighs. No numbness tingling or paresthesia. History of lumbar fusion at L4-5 with fairly current MRI showing adjacent level facet arthropathy but no central stenosis or herniated disc. Her pain seems to be facet mediated pain to a large degree. Some of it is likely myofascial. She does have postlaminectomy syndrome and chronic pain syndrome for quite some time. She has been on chronic narcotics in the past but is not currently on those. We did discuss that we are not pain management  in terms of medication management although we could try some things at junk of late. I think she would benefit from bilateral facet joint blocks below the fusion. She did well with that we could look eventually at radiofrequency ablation. We will get notes from Dr. Nickola Majoralton-Bethea. If if she performed bilateral L5 transforaminal injections is also something to look at.  The patient said she did get 3 months of relief from whatever was performed. We'll see her back for the injection.    Meds & Orders: No orders of the defined types were placed in this encounter.  No orders of the defined types were placed in this encounter.   Follow-up: Return for Bilateral L5-S1 facet joint blocks.   Procedures: No procedures performed  No notes on file   Clinical History: Lumbar Spine MRI 01/17/2016 FINDINGS: #Osseous structures: Prior posterior decompression with hardware fixation and interbody spacer at L4-L5. The vertebral body heights and marrow signal are unremarkable. No evidence of an acute fracture. Thoracolumbar anterolateral osteophytes are  appreciated. #Alignment:Alignment maintained. #Conus medullaris/cauda equina: Spinal cord signal and termination are within normal limits.  #Lower thoracic spine: No significant spinal canal or foraminal stenosis. This is viewed on the sagittal images only.  #T12-L1: No significant disc herniation, spinal canal or neuroforaminal compromise. This is viewed on sagittal images only. #L1-L2: No significant disc herniation, spinal canal or neuroforaminal compromise. This is viewed on the sagittal images only. #L2-L3: No significant disc herniation, spinal canal or neural foraminal compromise. #L3-L4: Mild facet hypertrophy on the LEFT. No significant disc herniation, spinal canal or neural foraminal compromise. #L4-L5: Postoperative changes. Spinal canal and foramen are patent. #L5-S1: Facet hypertrophy on the RIGHT. No significant disc herniation. Mild RIGHT neuroforaminal stenosis. The LEFT neuroforamen and spinal canal are patent.  #Paraspinal tissues: Postoperative changes at L4-L5.  Cervcial MRI 01/2016 IMPRESSION: 1.Moderate cervical spondylosis most prominently at C4-C5 and C5-C6 with degenerative facet changes throughout the cervical levels. 2.Multilevel disc bulges, disc osteophytes and  small protrusions mildly effaces the anterior thecal sac and mildly to moderately narrow the neural foramina most prominently on the right at C4-C5. Possible mild impingement on the exiting right C5 root.  She reports that she has never smoked. She has never used smokeless tobacco. No results for input(s): HGBA1C, LABURIC in the last 8760 hours.  Objective:  VS:  HT:    WT:   BMI:     BP:(!) 148/93  HR:83bpm  TEMP: ( )  RESP:  Physical Exam  Constitutional: She is oriented to person, place, and time. She appears well-developed and well-nourished. No distress.  Obese  HENT:  Head: Normocephalic and atraumatic.  Nose: Nose normal.  Mouth/Throat: Oropharynx is clear and moist.  Eyes: Conjunctivae are normal. Pupils are equal, round, and reactive to light.  Neck: Normal range of motion. Neck supple.  Cardiovascular: Regular rhythm and intact distal pulses.   Pulmonary/Chest: Effort normal and breath sounds normal.  Abdominal: She exhibits no distension.  Musculoskeletal:  Patient is slow to rise from a seated position with concordant low back pain with extension rotation. She has no pain over the greater trochanters. She has no pain with hip rotation. She does have some crepitus with movement of both knees. She has no clonus bilaterally with good distal strength.  Neurological: She is alert and oriented to person, place, and time. She displays normal reflexes. She exhibits normal muscle tone. Coordination normal.  Skin: Skin is warm. No rash noted. No erythema.  Psychiatric: She has a  normal mood and affect. Her behavior is normal.  Nursing note and vitals reviewed.   Ortho Exam Imaging: No results found.  Past Medical/Family/Surgical/Social History: Medications & Allergies reviewed per EMR Patient Active Problem List   Diagnosis Date Noted  . Spondylolisthesis of lumbar region 08/08/2013   Past Medical History:  Diagnosis Date  . Anxiety   . Depression   . Hypertension     History reviewed. No pertinent family history. Past Surgical History:  Procedure Laterality Date  . CARDIAC CATHETERIZATION     15 yrs ago ..clean  . CESAREAN SECTION     Social History   Occupational History  . Not on file.   Social History Main Topics  . Smoking status: Never Smoker  . Smokeless tobacco: Never Used  . Alcohol use Yes     Comment: occ  . Drug use: No  . Sexual activity: Not on file

## 2016-06-19 ENCOUNTER — Ambulatory Visit
Admission: RE | Admit: 2016-06-19 | Discharge: 2016-06-19 | Disposition: A | Discharge status: Home / Self Care | Payer: Medicare HMO | Source: Ambulatory Visit | Attending: Internal Medicine | Admitting: Internal Medicine

## 2016-06-19 DIAGNOSIS — Z1231 Encounter for screening mammogram for malignant neoplasm of breast: Secondary | ICD-10-CM

## 2016-06-19 NOTE — Telephone Encounter (Signed)
Faxed auth form with notes to [employer] 

## 2016-06-25 NOTE — Telephone Encounter (Signed)
Scheduled for 06/30/16 at 0945 with driver.

## 2016-06-25 NOTE — Telephone Encounter (Signed)
Received auth. [employer]#[id number]#[id number]. eff 06/19/16-08/03/16. Left message # 1 for pt to call back for scheduling.

## 2016-06-30 ENCOUNTER — Encounter (INDEPENDENT_AMBULATORY_CARE_PROVIDER_SITE_OTHER): Payer: Self-pay | Admitting: Physical Medicine and Rehabilitation

## 2016-06-30 ENCOUNTER — Ambulatory Visit (INDEPENDENT_AMBULATORY_CARE_PROVIDER_SITE_OTHER): Payer: Self-pay

## 2016-06-30 ENCOUNTER — Ambulatory Visit (INDEPENDENT_AMBULATORY_CARE_PROVIDER_SITE_OTHER): Payer: Medicare HMO | Admitting: Physical Medicine and Rehabilitation

## 2016-06-30 VITALS — BP 146/92 | HR 78 | Temp 98.0°F

## 2016-06-30 DIAGNOSIS — M47816 Spondylosis without myelopathy or radiculopathy, lumbar region: Secondary | ICD-10-CM | POA: Diagnosis not present

## 2016-06-30 MED ORDER — LIDOCAINE HCL (PF) 1 % IJ SOLN
0.3300 mL | Freq: Once | INTRAMUSCULAR | Status: AC
  Administered 2016-06-30: 0.3 mL

## 2016-06-30 MED ORDER — METHYLPREDNISOLONE ACETATE 80 MG/ML IJ SUSP
80.0000 mg | Freq: Once | INTRAMUSCULAR | Status: AC
  Administered 2016-06-30: 80 mg

## 2016-06-30 NOTE — Procedures (Signed)
Lumbar Facet Joint Intra-Articular Injection(s) with Fluoroscopic Guidance  Patient: Ariel Fisher      Date of Birth: 22-Nov-1945 MRN: [id number] PCP: Alva GarnetSHELTON,KIMBERLY R., MD      Visit Date: 06/30/2016   Universal Protocol:    Date/Time: 03/12/189:56 AM  Consent Given By: the patient  Position: PRONE   Additional Comments: Vital signs were monitored before and after the procedure. Patient was prepped and draped in the usual sterile fashion. The correct patient, procedure, and site was verified.   Injection Procedure Details:  Procedure Site One Meds Administered:  Meds ordered this encounter  Medications  . lidocaine (PF) (XYLOCAINE) 1 % injection 0.3 mL  . methylPREDNISolone acetate (DEPO-MEDROL) injection 80 mg     Laterality: Bilateral  Location/Site:  L5-S1  Needle size: 22 guage  Needle type: Spinal  Needle Placement: Articular  Findings:  -Contrast Used: 1 mL iohexol 180 mg iodine/mL   -Comments: Due to the patient's body habitus as well as instrumented fusion and use of lower intensity radiation the imaging was fairly fuzzy. We could localize the joints fairly well and can see contrast and it appear that we did get intra-articular flow.  Procedure Details: The fluoroscope beam is vertically oriented in AP, and the inferior recess is visualized beneath the lower pole of the inferior apophyseal process, which represents the target point for needle insertion. When direct visualization is difficult the target point is located at the medial projection of the vertebral pedicle. The region overlying each aforementioned target is locally anesthetized with a 1 to 2 ml. volume of 1% Lidocaine without Epinephrine.   The spinal needle was inserted into each of the above mentioned facet joints using biplanar fluoroscopic guidance. A 0.25 to 0.5 ml. volume of Isovue-250 was injected and a partial facet joint arthrogram was obtained. A single spot film was obtained of the  resulting arthrogram.    One to 1.25 ml of the steroid/anesthetic solution was then injected into each of the facet joints noted above.   Additional Comments:  The patient tolerated the procedure well No complications occurred Dressing: Band-Aid    Post-procedure details: Patient was observed during the procedure. Post-procedure instructions were reviewed.  Patient left the clinic in stable condition.

## 2016-06-30 NOTE — Progress Notes (Signed)
Ariel Fisher - 71 y.o. female MRN [id number]  Date of birth: [birth year]  Office Visit Note: Visit Date: 06/30/2016 PCP: Alva Garnet., MD Referred by: Andi Devon, MD  Subjective: Chief Complaint  Patient presents with  . Lower Back - Pain   HPI: Ariel Fisher is a pleasant 71 year old female who is here today for planned bilateral L5-S1 facet injection. No change in symptoms. By way of brief review of history she has had prior L4-5 instrumented fusion by Dr. Lovell Sheehan. We have seen her before that time with epidural injections which were beneficial for short while but she had pretty significant stenosis. Before seeing me on the last visit she had seen Dr. Nickola Major. Dr. Ozzie Hoyle notes were reviewed since the last time I saw the patient and she did have bilateral L5 transforaminal injections performed. The patient's MRI findings do not suggest any nerve compression or stenosis and I think some of her symptoms are really coming from the facet joints bilaterally. Dr. Eduard Clos also noted that she felt like maybe this was somewhat facet arthropathy as well.    ROS Otherwise per HPI.  Assessment & Plan: Visit Diagnoses:  1. Spondylosis without myelopathy or radiculopathy, lumbar region     Plan: Findings:  Bilateral L5-S1 facet joint blocks. Unfortunately due to the patient's body habitus as well as instrumented fusion the fluoroscopic images were very hard to see if felt like we did get good injections into the joints. She would represent a difficult radiofrequency ablation situation.    Meds & Orders:  Meds ordered this encounter  Medications  . lidocaine (PF) (XYLOCAINE) 1 % injection 0.3 mL  . methylPREDNISolone acetate (DEPO-MEDROL) injection 80 mg    Orders Placed This Encounter  Procedures  . Facet Injection  . XR C-ARM NO REPORT    Follow-up: Return if symptoms worsen or fail to improve, 2 weeks.   Procedures: No procedures performed  Lumbar Facet Joint  Intra-Articular Injection(s) with Fluoroscopic Guidance  Patient: Ariel Fisher      Date of Birth: Jun 12, 1945 MRN: [id number] PCP: Alva Garnet., MD      Visit Date: 06/30/2016   Universal Protocol:    Date/Time: 03/12/189:56 AM  Consent Given By: the patient  Position: PRONE   Additional Comments: Vital signs were monitored before and after the procedure. Patient was prepped and draped in the usual sterile fashion. The correct patient, procedure, and site was verified.   Injection Procedure Details:  Procedure Site One Meds Administered:  Meds ordered this encounter  Medications  . lidocaine (PF) (XYLOCAINE) 1 % injection 0.3 mL  . methylPREDNISolone acetate (DEPO-MEDROL) injection 80 mg     Laterality: Bilateral  Location/Site:  L5-S1  Needle size: 22 guage  Needle type: Spinal  Needle Placement: Articular  Findings:  -Contrast Used: 1 mL iohexol 180 mg iodine/mL   -Comments: Due to the patient's body habitus as well as instrumented fusion and use of lower intensity radiation the imaging was fairly fuzzy. We could localize the joints fairly well and can see contrast and it appear that we did get intra-articular flow.  Procedure Details: The fluoroscope beam is vertically oriented in AP, and the inferior recess is visualized beneath the lower pole of the inferior apophyseal process, which represents the target point for needle insertion. When direct visualization is difficult the target point is located at the medial projection of the vertebral pedicle. The region overlying each aforementioned target is locally anesthetized with a 1 to 2 ml. volume of  1% Lidocaine without Epinephrine.   The spinal needle was inserted into each of the above mentioned facet joints using biplanar fluoroscopic guidance. A 0.25 to 0.5 ml. volume of Isovue-250 was injected and a partial facet joint arthrogram was obtained. A single spot film was obtained of the resulting arthrogram.     One to 1.25 ml of the steroid/anesthetic solution was then injected into each of the facet joints noted above.   Additional Comments:  The patient tolerated the procedure well No complications occurred Dressing: Band-Aid    Post-procedure details: Patient was observed during the procedure. Post-procedure instructions were reviewed.  Patient left the clinic in stable condition.     Clinical History: Lumbar Spine MRI 01/17/2016 FINDINGS: #Osseous structures: Prior posterior decompression with hardware fixation and interbody spacer at L4-L5. The vertebral body heights and marrow signal are unremarkable. No evidence of an acute fracture. Thoracolumbar anterolateral osteophytes are  appreciated. #Alignment:Alignment maintained. #Conus medullaris/cauda equina: Spinal cord signal and termination are within normal limits.  #Lower thoracic spine: No significant spinal canal or foraminal stenosis. This is viewed on the sagittal images only.  #T12-L1: No significant disc herniation, spinal canal or neuroforaminal compromise. This is viewed on sagittal images only. #L1-L2: No significant disc herniation, spinal canal or neuroforaminal compromise. This is viewed on the sagittal images only. #L2-L3: No significant disc herniation, spinal canal or neural foraminal compromise. #L3-L4: Mild facet hypertrophy on the LEFT. No significant disc herniation, spinal canal or neural foraminal compromise. #L4-L5: Postoperative changes. Spinal canal and foramen are patent. #L5-S1: Facet hypertrophy on the RIGHT. No significant disc herniation. Mild RIGHT neuroforaminal stenosis. The LEFT neuroforamen and spinal canal are patent.  #Paraspinal tissues: Postoperative changes at L4-L5.  Cervcial MRI 01/2016 IMPRESSION: 1.Moderate cervical spondylosis most prominently at C4-C5 and C5-C6 with degenerative facet changes throughout the cervical levels. 2.Multilevel disc bulges, disc  osteophytes and small protrusions mildly effaces the anterior thecal sac and mildly to moderately narrow the neural foramina most prominently on the right at C4-C5. Possible mild impingement on the exiting right C5 root.  She reports that she has never smoked. She has never used smokeless tobacco. No results for input(s): HGBA1C, LABURIC in the last 8760 hours.  Objective:  VS:  HT:    WT:   BMI:     BP:(!) 146/92  HR:78bpm  TEMP:98 F (36.7 C)( )  RESP:97 % Physical Exam  Musculoskeletal:  Patient is slow to rise from a seated position. She ambulates without aid with good distal strength.    Ortho Exam Imaging: Xr C-arm No Report  Result Date: 06/30/2016 Please see Notes or Procedures tab for imaging impression.   Past Medical/Family/Surgical/Social History: Medications & Allergies reviewed per EMR Patient Active Problem List   Diagnosis Date Noted  . Spondylolisthesis of lumbar region 08/08/2013   Past Medical History:  Diagnosis Date  . Anxiety   . Depression   . Hypertension    History reviewed. No pertinent family history. Past Surgical History:  Procedure Laterality Date  . CARDIAC CATHETERIZATION     15 yrs ago ..clean  . CESAREAN SECTION     Social History   Occupational History  . Not on file.   Social History Main Topics  . Smoking status: Never Smoker  . Smokeless tobacco: Never Used  . Alcohol use Yes     Comment: occ  . Drug use: No  . Sexual activity: Not on file

## 2016-06-30 NOTE — Patient Instructions (Signed)

## 2016-08-26 ENCOUNTER — Telehealth (INDEPENDENT_AMBULATORY_CARE_PROVIDER_SITE_OTHER): Payer: Self-pay | Admitting: Physical Medicine and Rehabilitation

## 2016-08-26 NOTE — Telephone Encounter (Signed)
Need to ask how much relief she had. Prior L5 TF esi by Dr. Nickola Majoralton-Bethea helped for 3 months. So, if last helped a lot we could repeat and look at RFA or if didn't help as much then do L5 TF

## 2016-08-27 NOTE — Telephone Encounter (Signed)
Faxed auth form to [employer]

## 2016-08-27 NOTE — Telephone Encounter (Signed)
Relief of at least 50% for over a month.  [employer] for bilateral [id number]   L5-S1 facets
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
## 2016-09-17 ENCOUNTER — Ambulatory Visit (INDEPENDENT_AMBULATORY_CARE_PROVIDER_SITE_OTHER): Payer: Self-pay

## 2016-09-17 ENCOUNTER — Encounter (INDEPENDENT_AMBULATORY_CARE_PROVIDER_SITE_OTHER): Payer: Self-pay | Admitting: Physical Medicine and Rehabilitation

## 2016-09-17 ENCOUNTER — Ambulatory Visit (INDEPENDENT_AMBULATORY_CARE_PROVIDER_SITE_OTHER): Payer: Medicare HMO | Admitting: Physical Medicine and Rehabilitation

## 2016-09-17 VITALS — BP 139/82 | HR 85 | Temp 98.0°F

## 2016-09-17 DIAGNOSIS — M47816 Spondylosis without myelopathy or radiculopathy, lumbar region: Secondary | ICD-10-CM | POA: Diagnosis not present

## 2016-09-17 DIAGNOSIS — G894 Chronic pain syndrome: Secondary | ICD-10-CM

## 2016-09-17 DIAGNOSIS — M961 Postlaminectomy syndrome, not elsewhere classified: Secondary | ICD-10-CM

## 2016-09-17 MED ORDER — LIDOCAINE HCL (PF) 1 % IJ SOLN
2.0000 mL | Freq: Once | INTRAMUSCULAR | Status: AC
  Administered 2016-09-17: 2 mL

## 2016-09-17 MED ORDER — METHYLPREDNISOLONE ACETATE 80 MG/ML IJ SUSP
80.0000 mg | Freq: Once | INTRAMUSCULAR | Status: AC
  Administered 2016-09-17: 80 mg

## 2016-09-17 NOTE — Progress Notes (Deleted)
Patient states she did well with injection up until around 4 weeks ago. Had over 50 % relief. Pain across back and down both legs to calf. Left side is worse. Worse with laying. Wakes at night with pain.

## 2016-09-17 NOTE — Patient Instructions (Signed)

## 2016-09-17 NOTE — Procedures (Signed)
Lumbar Facet Joint Intra-Articular Injection(s) with Fluoroscopic Guidance  Patient: Ariel Fisher      Date of Birth: 15-Jul-1945 MRN: [id number] PCP: Andi DevonShelton, Kimberly, MD      Visit Date: 09/17/2016   [name] is a very pleasant 71 year old female with lumbar fusion but not to use at L5-S1. She did well with prior facet joint blocks below the fusion in March. She has got 50% relief overall repeat the injection today. Depending on how she does we could look at possible diagnostic medial branch blocks and radiofrequency ablation. She would be a good candidate for spinal cord stimulator trial. She has failed conservative care otherwise. Please see our prior evaluation and management notes for further details and justification.  Universal Protocol:    Date/Time: 05/30/181:39 PM  Consent Given By: the patient  Position: PRONE   Additional Comments: Vital signs were monitored before and after the procedure. Patient was prepped and draped in the usual sterile fashion. The correct patient, procedure, and site was verified.   Injection Procedure Details:  Procedure Site One Meds Administered:  Meds ordered this encounter  Medications  . lidocaine (PF) (XYLOCAINE) 1 % injection 2 mL  . methylPREDNISolone acetate (DEPO-MEDROL) injection 80 mg     Laterality: Bilateral  Location/Site:  L5-S1  Needle size: 22 guage  Needle type: Spinal  Needle Placement: Articular  Findings:  -Contrast Used: 1 mL iohexol 180 mg iodine/mL   -Comments: Excellent flow of contrast producing a partial arthrogram.  Procedure Details: The fluoroscope beam is vertically oriented in AP, and the inferior recess is visualized beneath the lower pole of the inferior apophyseal process, which represents the target point for needle insertion. When direct visualization is difficult the target point is located at the medial projection of the vertebral pedicle. The region overlying each aforementioned target is  locally anesthetized with a 1 to 2 ml. volume of 1% Lidocaine without Epinephrine.   The spinal needle was inserted into each of the above mentioned facet joints using biplanar fluoroscopic guidance. A 0.25 to 0.5 ml. volume of Isovue-250 was injected and a partial facet joint arthrogram was obtained. A single spot film was obtained of the resulting arthrogram.    One to 1.25 ml of the steroid/anesthetic solution was then injected into each of the facet joints noted above.   Additional Comments:  The patient tolerated the procedure well Dressing: Band-Aid    Post-procedure details: Patient was observed during the procedure. Post-procedure instructions were reviewed.  Patient left the clinic in stable condition.

## 2016-09-18 ENCOUNTER — Encounter (INDEPENDENT_AMBULATORY_CARE_PROVIDER_SITE_OTHER): Payer: Self-pay | Admitting: Physical Medicine and Rehabilitation

## 2016-09-30 ENCOUNTER — Telehealth (INDEPENDENT_AMBULATORY_CARE_PROVIDER_SITE_OTHER): Payer: Self-pay

## 2016-09-30 NOTE — Telephone Encounter (Signed)
Pt called. Had bil L5-S1 facet injection on 09/17/16. Had some relief for a few days with the back pain but still having spasms down both legs which is causing her to not be able to sleep. Wants to know what you recommend?

## 2016-09-30 NOTE — Telephone Encounter (Signed)
Pt tentatively scheduled for 10/13/16. Needs [employer] precert.

## 2016-09-30 NOTE — Telephone Encounter (Signed)
Bilateral L5 TF esi and discuss medication etc at that time

## 2016-10-07 ENCOUNTER — Ambulatory Visit (HOSPITAL_COMMUNITY)
Admission: EM | Admit: 2016-10-07 | Discharge: 2016-10-07 | Disposition: A | Discharge status: Home / Self Care | Payer: Medicare HMO | Attending: Internal Medicine | Admitting: Internal Medicine

## 2016-10-07 ENCOUNTER — Encounter (HOSPITAL_COMMUNITY): Payer: Self-pay | Admitting: Family Medicine

## 2016-10-07 DIAGNOSIS — M79605 Pain in left leg: Secondary | ICD-10-CM | POA: Diagnosis not present

## 2016-10-07 DIAGNOSIS — M791 Myalgia: Secondary | ICD-10-CM

## 2016-10-07 DIAGNOSIS — M7918 Myalgia, other site: Secondary | ICD-10-CM

## 2016-10-07 MED ORDER — DICLOFENAC SODIUM 75 MG PO TBEC: 75.0000 mg | DELAYED_RELEASE_TABLET | Freq: Two times a day (BID) | ORAL | 0 refills | Status: DC
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
## 2016-10-13 ENCOUNTER — Encounter (INDEPENDENT_AMBULATORY_CARE_PROVIDER_SITE_OTHER): Payer: Medicare HMO | Admitting: Physical Medicine and Rehabilitation

## 2016-10-17 NOTE — Telephone Encounter (Signed)
S/w Eber Jonesarolyn @ [employer]. [id number] bilateral is approved. Auth#[id number]. eff 10/07/16-11/21/16. Pt scheduled for 10/28/16.

## 2016-10-30 ENCOUNTER — Ambulatory Visit (INDEPENDENT_AMBULATORY_CARE_PROVIDER_SITE_OTHER): Payer: Medicare HMO | Admitting: Physical Medicine and Rehabilitation

## 2016-10-30 ENCOUNTER — Ambulatory Visit (INDEPENDENT_AMBULATORY_CARE_PROVIDER_SITE_OTHER): Payer: Medicare HMO

## 2016-10-30 ENCOUNTER — Encounter (INDEPENDENT_AMBULATORY_CARE_PROVIDER_SITE_OTHER): Payer: Self-pay | Admitting: Physical Medicine and Rehabilitation

## 2016-10-30 VITALS — BP 155/101 | HR 76

## 2016-10-30 DIAGNOSIS — M5416 Radiculopathy, lumbar region: Secondary | ICD-10-CM

## 2016-10-30 DIAGNOSIS — M961 Postlaminectomy syndrome, not elsewhere classified: Secondary | ICD-10-CM | POA: Diagnosis not present

## 2016-10-30 MED ORDER — METHYLPREDNISOLONE ACETATE 80 MG/ML IJ SUSP
80.0000 mg | Freq: Once | INTRAMUSCULAR | Status: AC
  Administered 2016-10-30: 80 mg

## 2016-10-30 MED ORDER — LIDOCAINE HCL (PF) 1 % IJ SOLN
2.0000 mL | Freq: Once | INTRAMUSCULAR | Status: AC
  Administered 2016-10-30: 2 mL

## 2016-10-30 NOTE — Patient Instructions (Signed)

## 2016-10-30 NOTE — Progress Notes (Deleted)
Patient states she had some relief with last injection for only a few days. Pain across low back and down both legs. Right=left. Says both legs are swollen.

## 2016-11-03 NOTE — Procedures (Signed)
[name] is a 71 year old female with prior lumbar surgery and chronic worsening low back pain with some pain down both legs but without tingling or paresthesia. She reports today that her legs are swollen. She is completed to facet joint blocks with the first facet joint block given her great relief for quite a while and the second facet joint block giving her more than 80% relief but not very long lasting. She seems very frustrated with her care even though we've talked about radiofrequency ablation type procedures for the facet joint arthritis. She would be a difficult case for radiofrequency ablation. She is fairly obese. She is going to physical therapy she is trying to do everything right to help herself out. I think the next step at least one time is diagnostic and hopefully therapeutic bilateral L5 transforaminal epidural steroid injection.  Lumbosacral Transforaminal Epidural Steroid Injection - Infraneural Approach with Fluoroscopic Guidance  Patient: Ariel Fisher      Date of Birth: September 29, 1945 MRN: [id number] PCP: Andi Devon, MD      Visit Date: 10/30/2016   Universal Protocol:     Consent Given By: the patient  Position: PRONE   Additional Comments: Vital signs were monitored before and after the procedure. Patient was prepped and draped in the usual sterile fashion. The correct patient, procedure, and site was verified.   Injection Procedure Details:  Procedure Site One Meds Administered:  Meds ordered this encounter  Medications  . lidocaine (PF) (XYLOCAINE) 1 % injection 2 mL  . methylPREDNISolone acetate (DEPO-MEDROL) injection 80 mg      Laterality: Bilateral  Location/Site:  L5-S1  Needle size: 22 G  Needle type: Spinal  Needle Placement: Transforaminal  Findings:  -Contrast Used: 0.5 mL iohexol 180 mg iodine/mL   -Comments: Excellent flow of contrast along the nerve and into the epidural space.  Procedure Details: After squaring off the  end-plates of the desired vertebral level to get a true AP view, the C-arm was obliqued to the painful side so that the superior articulating process is positioned about 1/3 the length of the inferior endplate.  The needle was aimed toward the junction of the superior articular process and the transverse process of the inferior vertebrae. The needle's initial entry is in the lower third of the foramen through Kambin's triangle. The soft tissues overlying this target were infiltrated with 2-3 ml. of 1% Lidocaine without Epinephrine.  The spinal needle was then inserted and advanced toward the target using a "trajectory" view along the fluoroscope beam.  Under AP and lateral visualization, the needle was advanced so it did not puncture dura and did not traverse medially beyond the 6 o'clock position of the pedicle. Bi-planar projections were used to confirm position. Aspiration was confirmed to be negative for CSF and/or blood. A 1-2 ml. volume of Isovue-250 was injected and flow of contrast was noted at each level. Radiographs were obtained for documentation purposes.   After attaining the desired flow of contrast documented above, a 0.5 to 1.0 ml test dose of 0.25% Marcaine was injected into each respective transforaminal space.  The patient was observed for 90 seconds post injection.  After no sensory deficits were reported, and normal lower extremity motor function was noted,   the above injectate was administered so that equal amounts of the injectate were placed at each foramen (level) into the transforaminal epidural space.   Additional Comments:  The patient tolerated the procedure well Dressing: Band-Aid    Post-procedure details: Patient was  observed during the procedure. Post-procedure instructions were reviewed.  Patient left the clinic in stable condition.

## 2016-11-04 ENCOUNTER — Telehealth (INDEPENDENT_AMBULATORY_CARE_PROVIDER_SITE_OTHER): Payer: Self-pay | Admitting: Physical Medicine and Rehabilitation

## 2016-11-04 NOTE — Telephone Encounter (Signed)
Records & billing 10/07/2016-present [facility]

## 2017-01-14 ENCOUNTER — Other Ambulatory Visit: Payer: Self-pay | Admitting: Internal Medicine

## 2017-01-14 ENCOUNTER — Ambulatory Visit
Admission: RE | Admit: 2017-01-14 | Discharge: 2017-01-14 | Disposition: A | Discharge status: Home / Self Care | Payer: Medicare HMO | Source: Ambulatory Visit | Attending: Internal Medicine | Admitting: Internal Medicine

## 2017-01-14 DIAGNOSIS — R0602 Shortness of breath: Secondary | ICD-10-CM

## 2017-01-14 DIAGNOSIS — R062 Wheezing: Secondary | ICD-10-CM

## 2017-03-25 ENCOUNTER — Telehealth (INDEPENDENT_AMBULATORY_CARE_PROVIDER_SITE_OTHER): Payer: Self-pay | Admitting: Physical Medicine and Rehabilitation

## 2017-03-25 NOTE — Telephone Encounter (Signed)
ok 

## 2017-03-26 NOTE — Telephone Encounter (Signed)
Needs auth for bilateral [id number]. 

## 2017-04-08 ENCOUNTER — Encounter (INDEPENDENT_AMBULATORY_CARE_PROVIDER_SITE_OTHER): Payer: Self-pay | Admitting: Physical Medicine and Rehabilitation

## 2017-04-08 ENCOUNTER — Ambulatory Visit (INDEPENDENT_AMBULATORY_CARE_PROVIDER_SITE_OTHER): Payer: Medicare HMO | Admitting: Physical Medicine and Rehabilitation

## 2017-04-08 ENCOUNTER — Ambulatory Visit (INDEPENDENT_AMBULATORY_CARE_PROVIDER_SITE_OTHER): Payer: Medicare HMO

## 2017-04-08 VITALS — BP 159/103 | HR 81

## 2017-04-08 DIAGNOSIS — M5416 Radiculopathy, lumbar region: Secondary | ICD-10-CM

## 2017-04-08 MED ORDER — BETAMETHASONE SOD PHOS & ACET 6 (3-3) MG/ML IJ SUSP
12.0000 mg | Freq: Once | INTRAMUSCULAR | Status: AC
  Administered 2017-04-08: 12 mg

## 2017-04-08 NOTE — Progress Notes (Deleted)
Patient is here for repeat bilateral L5 TF injections. She got about 60% relief from the injection done 10/30/2016. She states that the pain has become more constant over the last 2 months with pain radiating all of the way down her right leg, across low back, and into left buttocks. She is unable to sleep at night. She denies taking any blood thinners and has driver with her today.

## 2017-04-08 NOTE — Patient Instructions (Signed)

## 2017-04-24 NOTE — Procedures (Signed)
Ariel Fisher is a very pleasant 72 year old female that I last saw in July completed bilateral L5 transforaminal epidural steroid injection for low back and radicular complaints it gave her more than 60% relief up until just recently.  She says the pain is become more constant over the last couple of months but with no new trauma or other issues.  It is more on the left than right buttocks.  She really is having difficulty sleeping at night.  We are going to repeat the bilateral L5 transforaminal epidural steroid injections diagnostically and hopefully therapeutically.  Lumbosacral Transforaminal Epidural Steroid Injection - Sub-Pedicular Approach with Fluoroscopic Guidance  Patient: Ariel Fisher      Date of Birth: 1945-07-29 MRN: [id number] PCP: Andi Devon, MD      Visit Date: 04/08/2017   Universal Protocol:    Date/Time: 04/08/2017  Consent Given By: the patient  Position: PRONE  Additional Comments: Vital signs were monitored before and after the procedure. Patient was prepped and draped in the usual sterile fashion. The correct patient, procedure, and site was verified.   Injection Procedure Details:  Procedure Site One Meds Administered:  Meds ordered this encounter  Medications  . betamethasone acetate-betamethasone sodium phosphate (CELESTONE) injection 12 mg    Laterality: Bilateral  Location/Site:  L5-S1  Needle size: 22 G  Needle type: Spinal  Needle Placement: Transforaminal  Findings:    -Comments: Excellent flow of contrast along the nerve and into the epidural space.  Procedure Details: After squaring off the end-plates to get a true AP view, the C-arm was positioned so that an oblique view of the foramen as noted above was visualized. The target area is just inferior to the "nose of the scotty dog" or sub pedicular. The soft tissues overlying this structure were infiltrated with 2-3 ml. of 1% Lidocaine without Epinephrine.  The spinal needle was  inserted toward the target using a "trajectory" view along the fluoroscope beam.  Under AP and lateral visualization, the needle was advanced so it did not puncture dura and was located close the 6 O'Clock position of the pedical in AP tracterory. Biplanar projections were used to confirm position. Aspiration was confirmed to be negative for CSF and/or blood. A 1-2 ml. volume of Isovue-250 was injected and flow of contrast was noted at each level. Radiographs were obtained for documentation purposes.   After attaining the desired flow of contrast documented above, a 0.5 to 1.0 ml test dose of 0.25% Marcaine was injected into each respective transforaminal space.  The patient was observed for 90 seconds post injection.  After no sensory deficits were reported, and normal lower extremity motor function was noted,   the above injectate was administered so that equal amounts of the injectate were placed at each foramen (level) into the transforaminal epidural space.   Additional Comments:  The patient tolerated the procedure well Dressing: Band-Aid    Post-procedure details: Patient was observed during the procedure. Post-procedure instructions were reviewed.  Patient left the clinic in stable condition.   Pertinent Imaging: Lumbar Spine MRI 01/17/2016 FINDINGS: #Osseous structures: Prior posterior decompression with hardware fixation and interbody spacer at L4-L5. The vertebral body heights and marrow signal are unremarkable. No evidence of an acute fracture. Thoracolumbar anterolateral osteophytes are  appreciated. #Alignment:Alignment maintained. #Conus medullaris/cauda equina: Spinal cord signal and termination are within normal limits.  #Lower thoracic spine: No significant spinal canal or foraminal stenosis. This is viewed on the sagittal images only.  #T12-L1: No significant disc herniation,  spinal canal or neuroforaminal compromise. This is viewed on sagittal images  only. #L1-L2: No significant disc herniation, spinal canal or neuroforaminal compromise. This is viewed on the sagittal images only. #L2-L3: No significant disc herniation, spinal canal or neural foraminal compromise. #L3-L4: Mild facet hypertrophy on the LEFT. No significant disc herniation, spinal canal or neural foraminal compromise. #L4-L5: Postoperative changes. Spinal canal and foramen are patent. #L5-S1: Facet hypertrophy on the RIGHT. No significant disc herniation. Mild RIGHT neuroforaminal stenosis. The LEFT neuroforamen and spinal canal are patent.  #Paraspinal tissues: Postoperative changes at L4-L5.  Cervcial MRI 01/2016 IMPRESSION: 1.Moderate cervical spondylosis most prominently at C4-C5 and C5-C6 with degenerative facet changes throughout the cervical levels. 2.Multilevel disc bulges, disc osteophytes and small protrusions mildly effaces the anterior thecal sac and mildly to moderately narrow the neural foramina most prominently on the right at C4-C5. Possible mild impingement on the exiting right C5 root.

## 2017-07-01 ENCOUNTER — Other Ambulatory Visit: Payer: Self-pay | Admitting: Internal Medicine

## 2017-07-01 DIAGNOSIS — Z1231 Encounter for screening mammogram for malignant neoplasm of breast: Secondary | ICD-10-CM

## 2017-07-22 ENCOUNTER — Ambulatory Visit
Admission: RE | Admit: 2017-07-22 | Discharge: 2017-07-22 | Disposition: A | Discharge status: Home / Self Care | Payer: Medicare HMO | Source: Ambulatory Visit | Attending: Internal Medicine | Admitting: Internal Medicine

## 2017-07-22 DIAGNOSIS — Z1231 Encounter for screening mammogram for malignant neoplasm of breast: Secondary | ICD-10-CM

## 2017-08-18 ENCOUNTER — Telehealth (INDEPENDENT_AMBULATORY_CARE_PROVIDER_SITE_OTHER): Payer: Self-pay | Admitting: Physical Medicine and Rehabilitation

## 2017-08-19 NOTE — Telephone Encounter (Signed)
OV, hard of hearing If I remember

## 2017-08-19 NOTE — Telephone Encounter (Signed)
Ok if standard questions answered yes

## 2017-08-19 NOTE — Telephone Encounter (Signed)
Patient reports that she got 45% or less relief from last injection. [employer]. Please advise.

## 2017-08-20 NOTE — Telephone Encounter (Signed)
Scheduled for 5/23 at 1000. Appointment added to wait list.

## 2017-09-01 ENCOUNTER — Encounter

## 2017-09-01 ENCOUNTER — Encounter (INDEPENDENT_AMBULATORY_CARE_PROVIDER_SITE_OTHER): Payer: Self-pay | Admitting: Physical Medicine and Rehabilitation

## 2017-09-01 ENCOUNTER — Telehealth (INDEPENDENT_AMBULATORY_CARE_PROVIDER_SITE_OTHER): Payer: Self-pay | Admitting: Physical Medicine and Rehabilitation

## 2017-09-01 ENCOUNTER — Ambulatory Visit (INDEPENDENT_AMBULATORY_CARE_PROVIDER_SITE_OTHER): Payer: Medicare HMO | Admitting: Physical Medicine and Rehabilitation

## 2017-09-01 VITALS — BP 140/94 | HR 82

## 2017-09-01 DIAGNOSIS — G894 Chronic pain syndrome: Secondary | ICD-10-CM

## 2017-09-01 DIAGNOSIS — M5416 Radiculopathy, lumbar region: Secondary | ICD-10-CM

## 2017-09-01 DIAGNOSIS — M961 Postlaminectomy syndrome, not elsewhere classified: Secondary | ICD-10-CM

## 2017-09-01 MED ORDER — PREGABALIN 75 MG PO CAPS
75.0000 mg | ORAL_CAPSULE | Freq: Two times a day (BID) | ORAL | 2 refills | Status: DC
Start: 2017-09-01 — End: 2018-12-08

## 2017-09-01 NOTE — Progress Notes (Addendum)
   Numeric Pain Rating Scale and Functional Assessment Average Pain 6 Pain Right Now 5 My pain is aching Pain is worse with: sitting and standing Pain improves with: nothing   In the last 24 hours, has pain interfered with the following? General activity 5 able to carry out your everyday physical activities such as walking, climbing stairs, carrying groceries, or moving a chair?  Relation with others 4 able to carry out your usual social activities and roles. 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

## 2017-09-02 NOTE — Telephone Encounter (Signed)
Auth No / Request ID [id number] Status Auto-Approved Decision Approved Effective Date 09/15/2017 Expiration Date 10/30/2017

## 2017-09-10 ENCOUNTER — Ambulatory Visit (INDEPENDENT_AMBULATORY_CARE_PROVIDER_SITE_OTHER): Payer: Medicare HMO | Admitting: Physical Medicine and Rehabilitation

## 2017-09-22 ENCOUNTER — Encounter

## 2017-09-22 ENCOUNTER — Ambulatory Visit (INDEPENDENT_AMBULATORY_CARE_PROVIDER_SITE_OTHER): Payer: Medicare HMO

## 2017-09-22 ENCOUNTER — Ambulatory Visit (INDEPENDENT_AMBULATORY_CARE_PROVIDER_SITE_OTHER): Payer: Medicare HMO | Admitting: Physical Medicine and Rehabilitation

## 2017-09-22 ENCOUNTER — Encounter (INDEPENDENT_AMBULATORY_CARE_PROVIDER_SITE_OTHER): Payer: Self-pay | Admitting: Physical Medicine and Rehabilitation

## 2017-09-22 VITALS — BP 153/98 | HR 82

## 2017-09-22 DIAGNOSIS — M5416 Radiculopathy, lumbar region: Secondary | ICD-10-CM

## 2017-09-22 MED ORDER — BETAMETHASONE SOD PHOS & ACET 6 (3-3) MG/ML IJ SUSP: 12.0000 mg | Freq: Once | INTRAMUSCULAR | Status: DC

## 2017-09-22 NOTE — Patient Instructions (Signed)

## 2017-09-22 NOTE — Progress Notes (Signed)
 .  Numeric Pain Rating Scale and Functional Assessment Average Pain 7   In the last MONTH (on 0-10 scale) has pain interfered with the following?  1. General activity like being  able to carry out your everyday physical activities such as walking, climbing stairs, carrying groceries, or moving a chair?  Rating(5)   +Driver, -BT, -Dye Allergies.  

## 2017-10-30 NOTE — Procedures (Signed)
Lumbosacral Transforaminal Epidural Steroid Injection - Sub-Pedicular Approach with Fluoroscopic Guidance  Patient: Ariel Fisher      Date of Birth: [birth year] MRN: [id number] PCP: Andi DevonShelton, Kimberly, MD      Visit Date: 09/22/2017   Universal Protocol:    Date/Time: 09/22/2017  Consent Given By: the patient  Position: PRONE  Additional Comments: Vital signs were monitored before and after the procedure. Patient was prepped and draped in the usual sterile fashion. The correct patient, procedure, and site was verified.   Injection Procedure Details:  Procedure Site One Meds Administered:  Meds ordered this encounter  Medications  . betamethasone acetate-betamethasone sodium phosphate (CELESTONE) injection 12 mg    Laterality: Left  Location/Site:  L5-S1  Needle size: 22 G  Needle type: Spinal  Needle Placement: Transforaminal  Findings:    -Comments: Excellent flow of contrast along the nerve and into the epidural space.  Procedure Details: After squaring off the end-plates to get a true AP view, the C-arm was positioned so that an oblique view of the foramen as noted above was visualized. The target area is just inferior to the "nose of the scotty dog" or sub pedicular. The soft tissues overlying this structure were infiltrated with 2-3 ml. of 1% Lidocaine without Epinephrine.  The spinal needle was inserted toward the target using a "trajectory" view along the fluoroscope beam.  Under AP and lateral visualization, the needle was advanced so it did not puncture dura and was located close the 6 O'Clock position of the pedical in AP tracterory. Biplanar projections were used to confirm position. Aspiration was confirmed to be negative for CSF and/or blood. A 1-2 ml. volume of Isovue-250 was injected and flow of contrast was noted at each level. Radiographs were obtained for documentation purposes.   After attaining the desired flow of contrast documented above, a 0.5 to  1.0 ml test dose of 0.25% Marcaine was injected into each respective transforaminal space.  The patient was observed for 90 seconds post injection.  After no sensory deficits were reported, and normal lower extremity motor function was noted,   the above injectate was administered so that equal amounts of the injectate were placed at each foramen (level) into the transforaminal epidural space.   Additional Comments:  The patient tolerated the procedure well Dressing: Band-Aid    Post-procedure details: Patient was observed during the procedure. Post-procedure instructions were reviewed.  Patient left the clinic in stable condition.

## 2017-10-30 NOTE — Progress Notes (Signed)
Ariel Fisher - 72 y.o. female MRN [id number]  Date of birth: [birth year]  Office Visit Note: Visit Date: 09/22/2017 PCP: Andi DevonShelton, Kimberly, MD Referred by: Andi DevonShelton, Kimberly, MD  Subjective: No chief complaint on file.  HPI: [name] is a 72 year old female with prior L4-5 instrumented lumbar fusion.  She has had chronic left radicular leg pain really since 2015.  Last injection performed was in December which was a bilateral L5 transforaminal injection.  She got more than 60% relief up until just recently.  We are going to repeat the injection as she has had no new symptoms but she is really having mostly left-sided leg pain today.  We are going to complete just a left-sided L4-5 transforaminal injection.  Depending on outcomes eventually she may be someone who probably would do well with spinal cord stimulator trial and implant.   ROS Otherwise per HPI.  Assessment & Plan: Visit Diagnoses:  1. Lumbar radiculopathy     Plan: No additional findings.   Meds & Orders:  Meds ordered this encounter  Medications  . betamethasone acetate-betamethasone sodium phosphate (CELESTONE) injection 12 mg    Orders Placed This Encounter  Procedures  . XR C-ARM NO REPORT  . Epidural Steroid injection    Follow-up: Return if symptoms worsen or fail to improve.   Procedures: No procedures performed  Lumbosacral Transforaminal Epidural Steroid Injection - Sub-Pedicular Approach with Fluoroscopic Guidance  Patient: Ariel Fisher      Date of Birth: [birth year] MRN: [id number] PCP: Andi DevonShelton, Kimberly, MD      Visit Date: 09/22/2017   Universal Protocol:    Date/Time: 09/22/2017  Consent Given By: the patient  Position: PRONE  Additional Comments: Vital signs were monitored before and after the procedure. Patient was prepped and draped in the usual sterile fashion. The correct patient, procedure, and site was verified.   Injection Procedure Details:  Procedure Site One Meds  Administered:  Meds ordered this encounter  Medications  . betamethasone acetate-betamethasone sodium phosphate (CELESTONE) injection 12 mg    Laterality: Left  Location/Site:  L5-S1  Needle size: 22 G  Needle type: Spinal  Needle Placement: Transforaminal  Findings:    -Comments: Excellent flow of contrast along the nerve and into the epidural space.  Procedure Details: After squaring off the end-plates to get a true AP view, the C-arm was positioned so that an oblique view of the foramen as noted above was visualized. The target area is just inferior to the "nose of the scotty dog" or sub pedicular. The soft tissues overlying this structure were infiltrated with 2-3 ml. of 1% Lidocaine without Epinephrine.  The spinal needle was inserted toward the target using a "trajectory" view along the fluoroscope beam.  Under AP and lateral visualization, the needle was advanced so it did not puncture dura and was located close the 6 O'Clock position of the pedical in AP tracterory. Biplanar projections were used to confirm position. Aspiration was confirmed to be negative for CSF and/or blood. A 1-2 ml. volume of Isovue-250 was injected and flow of contrast was noted at each level. Radiographs were obtained for documentation purposes.   After attaining the desired flow of contrast documented above, a 0.5 to 1.0 ml test dose of 0.25% Marcaine was injected into each respective transforaminal space.  The patient was observed for 90 seconds post injection.  After no sensory deficits were reported, and normal lower extremity motor function was noted,   the above injectate was administered so that  equal amounts of the injectate were placed at each foramen (level) into the transforaminal epidural space.   Additional Comments:  The patient tolerated the procedure well Dressing: Band-Aid    Post-procedure details: Patient was observed during the procedure. Post-procedure instructions were  reviewed.  Patient left the clinic in stable condition.    Clinical History: Lumbar Spine MRI 01/17/2016 FINDINGS: #Osseous structures: Prior posterior decompression with hardware fixation and interbody spacer at L4-L5. The vertebral body heights and marrow signal are unremarkable. No evidence of an acute fracture. Thoracolumbar anterolateral osteophytes are  appreciated. #Alignment:Alignment maintained. #Conus medullaris/cauda equina: Spinal cord signal and termination are within normal limits.  #Lower thoracic spine: No significant spinal canal or foraminal stenosis. This is viewed on the sagittal images only.  #T12-L1: No significant disc herniation, spinal canal or neuroforaminal compromise. This is viewed on sagittal images only. #L1-L2: No significant disc herniation, spinal canal or neuroforaminal compromise. This is viewed on the sagittal images only. #L2-L3: No significant disc herniation, spinal canal or neural foraminal compromise. #L3-L4: Mild facet hypertrophy on the LEFT. No significant disc herniation, spinal canal or neural foraminal compromise. #L4-L5: Postoperative changes. Spinal canal and foramen are patent. #L5-S1: Facet hypertrophy on the RIGHT. No significant disc herniation. Mild RIGHT neuroforaminal stenosis. The LEFT neuroforamen and spinal canal are patent.  #Paraspinal tissues: Postoperative changes at L4-L5.  Cervcial MRI 01/2016 IMPRESSION: 1.Moderate cervical spondylosis most prominently at C4-C5 and C5-C6 with degenerative facet changes throughout the cervical levels. 2.Multilevel disc bulges, disc osteophytes and small protrusions mildly effaces the anterior thecal sac and mildly to moderately narrow the neural foramina most prominently on the right at C4-C5. Possible mild impingement on the exiting right C5 root.   She reports that she has never smoked. She has never used smokeless tobacco. No results for input(s): HGBA1C,  LABURIC in the last 8760 hours.  Objective:  VS:  HT:    WT:   BMI:     BP:(!) 153/98  HR:82bpm  TEMP: ( )  RESP:  Physical Exam  Ortho Exam Imaging: No results found.  Past Medical/Family/Surgical/Social History: Medications & Allergies reviewed per EMR, new medications updated. Patient Active Problem List   Diagnosis Date Noted  . Spondylolisthesis of lumbar region 08/08/2013   Past Medical History:  Diagnosis Date  . Anxiety   . Depression   . Hypertension    History reviewed. No pertinent family history. Past Surgical History:  Procedure Laterality Date  . CARDIAC CATHETERIZATION     15 yrs ago ..clean  . CESAREAN SECTION     Social History   Occupational History  . Not on file  Tobacco Use  . Smoking status: Never Smoker  . Smokeless tobacco: Never Used  Substance and Sexual Activity  . Alcohol use: Yes    Comment: occ  . Drug use: No  . Sexual activity: Not on file

## 2017-12-07 NOTE — Progress Notes (Signed)
Ariel Fisher - 72 y.o. female MRN [id number]  Date of birth: [birth year]  Office Visit Note: Visit Date: 09/01/2017 PCP: Ariel Devon, MD Referred by: Ariel Devon, MD  Subjective: Chief Complaint  Patient presents with  . Lower Back - Pain   HPI: Ariel Fisher is a 72 year old female with history of prior lumbar instrumented fusion at L4-5 which was completed in 2015 by Dr. Tressie Fisher.  She did well after surgery but has continued as of late to have increasing low back pain with radicular complaints down the legs.  She is tried all manner of medications other than gabapentin or Lyrica.  She has had no new injuries and no new issues.  Pain is bilateral left more than right down the posterior lateral legs to the great toes bilaterally in a more L5 distribution.  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  She is had no fevers chills night sweats or night pain.  No unintended weight loss.  She has had therapy and continues to do back exercises to a degree but has difficulty maintaining this.  She reports medications have not been beneficial at all.  She reports that sitting decreases her symptoms but standing and walking really increases her pain.  She can walk for a short time after standing and that seems to help but then more walking will actually worsen her symptoms.  She has had prior L5 injection in the past with decent relief that has been sometime ago.  Her average pain is 6 out of 10.  She reports mostly an aching toothache type pain.  She does get some pain that is affecting her daily activities.   Review of Systems  Constitutional: Negative for chills, fever, malaise/fatigue and weight loss.  HENT: Negative for hearing loss and sinus pain.   Eyes: Negative for blurred vision, double vision and photophobia.  Respiratory: Negative for cough and shortness of breath.     Cardiovascular: Negative for chest pain, palpitations and leg swelling.  Gastrointestinal: Negative for abdominal pain, nausea and vomiting.  Genitourinary: Negative for flank pain.  Musculoskeletal: Positive for back pain and joint pain. Negative for myalgias.       Bilateral radicular pain  Skin: Negative for itching and rash.  Neurological: Positive for tingling. Negative for tremors, focal weakness and weakness.  Endo/Heme/Allergies: Negative.   Psychiatric/Behavioral: Negative for depression.  All other systems reviewed and are negative.  Otherwise per HPI.  Assessment & Plan: Visit Diagnoses:  1. Lumbar radiculopathy   2. Post laminectomy syndrome   3. Chronic pain syndrome     Plan: Findings:  Chronic pain syndrome and postlaminectomy syndrome with bilateral low back and radicular leg pain in a classic L5 distribution but symptoms correlated to level below her prior fusion.  She has had MRI completed post fusion showing some adjacent level disease.  This was done a couple years ago and there is really been nothing new except for continued worsening pain.  Prior injection was beneficial for quite some time and that did seem to diminish her symptoms and give her some quality of life.  She has not really tried any other medications other than initially before the surgery.  She really cannot take pain medications like tramadol and opioids.  We are going to try Lyrica and see how she does with Lyrica.  Prescription was given and we told her how to possibly get this  done on a trial basis through using a coupon.  We will see her back with injection and L5 from a transforaminal approach.    Meds & Orders:  Meds ordered this encounter  Medications  . pregabalin (LYRICA) 75 MG capsule    Sig: Take 1 capsule (75 mg total) by mouth 2 (two) times daily. Start with one cap at night for one week, then as directed.    Dispense:  60 capsule    Refill:  2   No orders of the defined types were  placed in this encounter.   Follow-up: Return for L5 transforaminal epidural steroid injection.   Procedures: No procedures performed  No notes on file   Clinical History: Lumbar Spine MRI 01/17/2016 FINDINGS: #Osseous structures: Prior posterior decompression with hardware fixation and interbody spacer at L4-L5. The vertebral body heights and marrow signal are unremarkable. No evidence of an acute fracture. Thoracolumbar anterolateral osteophytes are  appreciated. #Alignment:Alignment maintained. #Conus medullaris/cauda equina: Spinal cord signal and termination are within normal limits.  #Lower thoracic spine: No significant spinal canal or foraminal stenosis. This is viewed on the sagittal images only.  #T12-L1: No significant disc herniation, spinal canal or neuroforaminal compromise. This is viewed on sagittal images only. #L1-L2: No significant disc herniation, spinal canal or neuroforaminal compromise. This is viewed on the sagittal images only. #L2-L3: No significant disc herniation, spinal canal or neural foraminal compromise. #L3-L4: Mild facet hypertrophy on the LEFT. No significant disc herniation, spinal canal or neural foraminal compromise. #L4-L5: Postoperative changes. Spinal canal and foramen are patent. #L5-S1: Facet hypertrophy on the RIGHT. No significant disc herniation. Mild RIGHT neuroforaminal stenosis. The LEFT neuroforamen and spinal canal are patent.  #Paraspinal tissues: Postoperative changes at L4-L5.  Cervcial MRI 01/2016 IMPRESSION: 1.Moderate cervical spondylosis most prominently at C4-C5 and C5-C6 with degenerative facet changes throughout the cervical levels. 2.Multilevel disc bulges, disc osteophytes and small protrusions mildly effaces the anterior thecal sac and mildly to moderately narrow the neural foramina most prominently on the right at C4-C5. Possible mild impingement on the exiting right C5 root.   She reports that  she has never smoked. She has never used smokeless tobacco. No results for input(s): HGBA1C, LABURIC in the last 8760 hours.  Objective:  VS:  HT:    WT:   BMI:     BP:(!) 140/94  HR:82bpm  TEMP: ( )  RESP:  Physical Exam  Constitutional: She is oriented to person, place, and time. She appears well-developed and well-nourished. No distress.  HENT:  Head: Normocephalic and atraumatic.  Nose: Nose normal.  Mouth/Throat: Oropharynx is clear and moist.  Eyes: Pupils are equal, round, and reactive to light. Conjunctivae are normal.  Neck: Normal range of motion. Neck supple.  Cardiovascular: Regular rhythm and intact distal pulses.  Pulmonary/Chest: Effort normal. No respiratory distress.  Abdominal: She exhibits no distension. There is no guarding.  Musculoskeletal:  Patient ambulates without a cane or aid.  She is somewhat slow to rise from a seated position is very stiff through extension.  She has no focal trigger points or tender points.  She is nontender over the greater trochanters or PSIS.  She has no groin pain with hip rotation.  She has good distal strength without deficit bilaterally.  She has a equivocal slump test on the left more than right with tight hamstring.  Neurological: She is alert and oriented to person, place, and time. She exhibits normal muscle tone. Coordination normal.  Skin: Skin is warm. No  rash noted. No erythema.  Psychiatric: She has a normal mood and affect. Her behavior is normal.  Nursing note and vitals reviewed.   Ortho Exam Imaging: No results found.  Past Medical/Family/Surgical/Social History: Medications & Allergies reviewed per EMR, new medications updated. Patient Active Problem List   Diagnosis Date Noted  . Spondylolisthesis of lumbar region 08/08/2013   Past Medical History:  Diagnosis Date  . Anxiety   . Depression   . Hypertension    History reviewed. No pertinent family history. Past Surgical History:  Procedure Laterality  Date  . CARDIAC CATHETERIZATION     15 yrs ago ..clean  . CESAREAN SECTION     Social History   Occupational History  . Not on file  Tobacco Use  . Smoking status: Never Smoker  . Smokeless tobacco: Never Used  Substance and Sexual Activity  . Alcohol use: Yes    Comment: occ  . Drug use: No  . Sexual activity: Not on file

## 2018-01-28 ENCOUNTER — Telehealth (INDEPENDENT_AMBULATORY_CARE_PROVIDER_SITE_OTHER): Payer: Self-pay | Admitting: Physical Medicine and Rehabilitation

## 2018-01-28 NOTE — Telephone Encounter (Signed)
ok 

## 2018-01-29 NOTE — Telephone Encounter (Signed)
Patient reports that she only got about 30% relief from the last injection, and this only lasted about a month. Please advise.

## 2018-02-03 NOTE — Telephone Encounter (Signed)
Needs Berkley Harvey [id number]. Left L5 TF.

## 2018-02-03 NOTE — Telephone Encounter (Signed)
Can try repeat versus OV and talk about options

## 2018-02-03 NOTE — Telephone Encounter (Signed)
Auth No / Request ID [id number] Status Auto-Approved Decision Approved Effective Date 02/08/2018 Expiration Date 03/25/2018  Pt is scheduled for inj 02/18/18 with driver.

## 2018-02-18 ENCOUNTER — Ambulatory Visit (INDEPENDENT_AMBULATORY_CARE_PROVIDER_SITE_OTHER): Payer: Self-pay

## 2018-02-18 ENCOUNTER — Ambulatory Visit (INDEPENDENT_AMBULATORY_CARE_PROVIDER_SITE_OTHER): Payer: Medicare HMO | Admitting: Physical Medicine and Rehabilitation

## 2018-02-18 ENCOUNTER — Encounter (INDEPENDENT_AMBULATORY_CARE_PROVIDER_SITE_OTHER): Payer: Self-pay | Admitting: Physical Medicine and Rehabilitation

## 2018-02-18 VITALS — BP 125/79 | HR 82 | Temp 98.2°F

## 2018-02-18 DIAGNOSIS — M5416 Radiculopathy, lumbar region: Secondary | ICD-10-CM | POA: Diagnosis not present

## 2018-02-18 DIAGNOSIS — G894 Chronic pain syndrome: Secondary | ICD-10-CM

## 2018-02-18 DIAGNOSIS — M47816 Spondylosis without myelopathy or radiculopathy, lumbar region: Secondary | ICD-10-CM

## 2018-02-18 DIAGNOSIS — M961 Postlaminectomy syndrome, not elsewhere classified: Secondary | ICD-10-CM

## 2018-02-18 MED ORDER — BETAMETHASONE SOD PHOS & ACET 6 (3-3) MG/ML IJ SUSP
12.0000 mg | Freq: Once | INTRAMUSCULAR | Status: AC
  Administered 2018-02-18: 12 mg

## 2018-02-18 NOTE — Patient Instructions (Signed)

## 2018-02-18 NOTE — Progress Notes (Signed)
 .  Numeric Pain Rating Scale and Functional Assessment Average Pain 8   In the last MONTH (on 0-10 scale) has pain interfered with the following?  1. General activity like being  able to carry out your everyday physical activities such as walking, climbing stairs, carrying groceries, or moving a chair?  Rating(5)   +Driver, -BT, -Dye Allergies.  

## 2018-03-02 ENCOUNTER — Encounter (INDEPENDENT_AMBULATORY_CARE_PROVIDER_SITE_OTHER): Payer: Self-pay | Admitting: Physical Medicine and Rehabilitation

## 2018-03-02 NOTE — Procedures (Signed)
Lumbosacral Transforaminal Epidural Steroid Injection - Sub-Pedicular Approach with Fluoroscopic Guidance  Patient: Ariel Fisher      Date of Birth: November 13, 1945 MRN: [id number] PCP: Andi DevonShelton, Kimberly, MD      Visit Date: 02/18/2018   Universal Protocol:    Date/Time: 02/18/2018  Consent Given By: the patient  Position: PRONE  Additional Comments: Vital signs were monitored before and after the procedure. Patient was prepped and draped in the usual sterile fashion. The correct patient, procedure, and site was verified.   Injection Procedure Details:  Procedure Site One Meds Administered:  Meds ordered this encounter  Medications  . betamethasone acetate-betamethasone sodium phosphate (CELESTONE) injection 12 mg    Laterality: Bilateral  Location/Site:  L5-S1  Needle size: 22 G  Needle type: Spinal  Needle Placement: Transforaminal  Findings:    -Comments: Excellent flow of contrast along the nerve and into the epidural space.  Procedure Details: After squaring off the end-plates to get a true AP view, the C-arm was positioned so that an oblique view of the foramen as noted above was visualized. The target area is just inferior to the "nose of the scotty dog" or sub pedicular. The soft tissues overlying this structure were infiltrated with 2-3 ml. of 1% Lidocaine without Epinephrine.  The spinal needle was inserted toward the target using a "trajectory" view along the fluoroscope beam.  Under AP and lateral visualization, the needle was advanced so it did not puncture dura and was located close the 6 O'Clock position of the pedical in AP tracterory. Biplanar projections were used to confirm position. Aspiration was confirmed to be negative for CSF and/or blood. A 1-2 ml. volume of Isovue-250 was injected and flow of contrast was noted at each level. Radiographs were obtained for documentation purposes.   After attaining the desired flow of contrast documented above, a  0.5 to 1.0 ml test dose of 0.25% Marcaine was injected into each respective transforaminal space.  The patient was observed for 90 seconds post injection.  After no sensory deficits were reported, and normal lower extremity motor function was noted,   the above injectate was administered so that equal amounts of the injectate were placed at each foramen (level) into the transforaminal epidural space.   Additional Comments:  The patient tolerated the procedure well Dressing: Band-Aid    Post-procedure details: Patient was observed during the procedure. Post-procedure instructions were reviewed.  Patient left the clinic in stable condition.

## 2018-03-02 NOTE — Progress Notes (Signed)
Ariel Fisher - 72 y.o. female MRN [id number]  Date of birth: 09-Oct-1945  Office Visit Note: Visit Date: 02/18/2018 PCP: Andi DevonShelton, Kimberly, MD Referred by: Andi DevonShelton, Kimberly, MD  Subjective: Chief Complaint  Patient presents with  . Lower Back - Pain  . Right Leg - Pain  . Left Leg - Pain   HPI: Ariel Fisher is a 72 y.o. female who comes in today For reevaluation of chronic long-term lumbar spine pain and bilateral low back and radicular leg pain.  We last saw her in June of this year and completed transforaminal epidural steroid injection which she reported 65% relief for 3 months and then progressive return of symptoms.  She really just tries to put off coming back in as long as she can.  She has not noted any new issues or trauma or falls.  Still radicular pain more of an L5 distribution down the bilateral lateral and posterior lateral side of the legs to the ankles.  She has had no focal weakness.  She has had prior lumbar fusion at L4-5.  At least in 2017 adjacent level disease really mainly with facet arthropathy but there is some neuroforaminal stenosis.  Above the surgery mild facet arthritis.  Injections in the past were much longer lived.  She takes diclofenac and Lyrica which seems to help.  She has not tried the duloxetine.  She does not tolerate tramadol.  Her BMI is 38.3.  She has had physical therapy she has had all other manner of conservative and nonconservative care.  Symptoms worse with standing and ambulating better at rest.  Review of Systems  Constitutional: Negative for chills, fever, malaise/fatigue and weight loss.  HENT: Negative for hearing loss and sinus pain.   Eyes: Negative for blurred vision, double vision and photophobia.  Respiratory: Negative for cough and shortness of breath.   Cardiovascular: Negative for chest pain, palpitations and leg swelling.  Gastrointestinal: Negative for abdominal pain, nausea and vomiting.  Genitourinary: Negative for flank  pain.  Musculoskeletal: Positive for back pain and joint pain. Negative for myalgias.       Bilateral leg pain  Skin: Negative for itching and rash.  Neurological: Positive for tingling. Negative for tremors, focal weakness and weakness.  Endo/Heme/Allergies: Negative.   Psychiatric/Behavioral: Negative for depression.  All other systems reviewed and are negative.  Otherwise per HPI.  Assessment & Plan: Visit Diagnoses:  1. Lumbar radiculopathy   2. Post laminectomy syndrome   3. 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  Prior epidural injection seem to give her more benefit for a lot longer time.  Last injection however was only in June of this year and she is really just put things off up until recently.  No new findings.  At this point we will repeat the bilateral L5 transforaminal injections.  We will do that today because of the severity of her symptoms.  Her pain is 8 out of 10.  Injection did help.  Look at adding something like duloxetine in the future.  Would also consider updating MRI findings that she is just not getting much relief because of really the nature of the prior MRI not showing much in way of nerve compression.  Another avenue would be electrodiagnostic study of both  lower limbs to look for possible peripheral polyneuropathy.  I would favor having this done at [facility] neurology or at [facility] neurology.    Meds & Orders:  Meds ordered this encounter  Medications  . betamethasone acetate-betamethasone sodium phosphate (CELESTONE) injection 12 mg    Orders Placed This Encounter  Procedures  . XR C-ARM NO REPORT  . Epidural Steroid injection    Follow-up: Return for Consider MRI vs Facets vs EMG/NCS.   Procedures: No procedures performed    Lumbosacral Transforaminal Epidural Steroid Injection - Sub-Pedicular Approach with Fluoroscopic Guidance  Patient: Ariel Fisher      Date of Birth: [birth year] MRN: [id number] PCP: Andi Devon, MD      Visit Date: 02/18/2018   Universal Protocol:    Date/Time: 02/18/2018  Consent Given By: the patient  Position: PRONE  Additional Comments: Vital signs were monitored before and after the procedure. Patient was prepped and draped in the usual sterile fashion. The correct patient, procedure, and site was verified.   Injection Procedure Details:  Procedure Site One Meds Administered:  Meds ordered this encounter  Medications  . betamethasone acetate-betamethasone sodium phosphate (CELESTONE) injection 12 mg    Laterality: Bilateral  Location/Site:  L5-S1  Needle size: 22 G  Needle type: Spinal  Needle Placement: Transforaminal  Findings:    -Comments: Excellent flow of contrast along the nerve and into the epidural space.  Procedure Details: After squaring off the end-plates to get a true AP view, the C-arm was positioned so that an oblique view of the foramen as noted above was visualized. The target area is just inferior to the "nose of the scotty dog" or sub pedicular. The soft tissues overlying this structure were infiltrated with 2-3 ml. of 1% Lidocaine without Epinephrine.  The spinal needle was inserted toward the target using a "trajectory" view along the fluoroscope beam.  Under AP and lateral visualization, the needle was advanced so it did not puncture dura and was located close the 6 O'Clock position of the pedical in AP tracterory. Biplanar projections were used to confirm position. Aspiration was confirmed to be negative for CSF and/or blood. A 1-2 ml. volume of Isovue-250 was injected and flow of contrast was noted at each level. Radiographs were obtained for documentation purposes.   After attaining the desired flow of contrast documented above, a  0.5 to 1.0 ml test dose of 0.25% Marcaine was injected into each respective transforaminal space.  The patient was observed for 90 seconds post injection.  After no sensory deficits were reported, and normal lower extremity motor function was noted,   the above injectate was administered so that equal amounts of the injectate were placed at each foramen (level) into the transforaminal epidural space.   Additional Comments:  The patient tolerated the procedure well Dressing: Band-Aid    Post-procedure details: Patient was observed during the procedure. Post-procedure instructions were reviewed.  Patient left the clinic in stable condition.    Clinical History: Lumbar Spine MRI 01/17/2016 FINDINGS: #Osseous structures: Prior posterior decompression with hardware fixation and interbody spacer at L4-L5. The vertebral body heights and marrow signal are unremarkable. No evidence of an acute fracture. Thoracolumbar anterolateral osteophytes are  appreciated. #Alignment:Alignment maintained. #Conus medullaris/cauda equina: Spinal cord signal and termination are within normal limits.  #Lower thoracic spine: No significant spinal canal or foraminal stenosis. This is viewed on the sagittal images only.  #T12-L1: No significant disc herniation, spinal canal or neuroforaminal compromise. This is viewed on sagittal images  only. #L1-L2: No significant disc herniation, spinal canal or neuroforaminal compromise. This is viewed on the sagittal images only. #L2-L3: No significant disc herniation, spinal canal or neural foraminal compromise. #L3-L4: Mild facet hypertrophy on the LEFT. No significant disc herniation, spinal canal or neural foraminal compromise. #L4-L5: Postoperative changes. Spinal canal and foramen are patent. #L5-S1: Facet hypertrophy on the RIGHT. No significant disc herniation. Mild RIGHT neuroforaminal stenosis. The LEFT neuroforamen and spinal canal are  patent.  #Paraspinal tissues: Postoperative changes at L4-L5.  Cervcial MRI 01/2016 IMPRESSION: 1.Moderate cervical spondylosis most prominently at C4-C5 and C5-C6 with degenerative facet changes throughout the cervical levels. 2.Multilevel disc bulges, disc osteophytes and small protrusions mildly effaces the anterior thecal sac and mildly to moderately narrow the neural foramina most prominently on the right at C4-C5. Possible mild impingement on the exiting right C5 root.   She reports that she has never smoked. She has never used smokeless tobacco. No results for input(s): HGBA1C, LABURIC in the last 8760 hours.  Objective:  VS:  HT:    WT:   BMI:     BP:125/79  HR:82bpm  TEMP:98.2 F (36.8 C)(Oral)  RESP:  Physical Exam  Constitutional: She is oriented to person, place, and time. She appears well-developed and well-nourished. No distress.  HENT:  Head: Normocephalic and atraumatic.  Nose: Nose normal.  Mouth/Throat: Oropharynx is clear and moist.  Eyes: Pupils are equal, round, and reactive to light. Conjunctivae are normal.  Neck: Normal range of motion. Neck supple.  Cardiovascular: Regular rhythm and intact distal pulses.  Pulmonary/Chest: Effort normal. No respiratory distress.  Abdominal: She exhibits no distension. There is no guarding.  Musculoskeletal:  Patient is slow to rise from a seated position to full extension.  She does have pain with facet joint loading.  She has positive Fortin finger sign on the right more than left.  No pain over the greater trochanters.  She has no pain with hip rotation.  She has good distal strength without clonus.  Neurological: She is alert and oriented to person, place, and time. She exhibits normal muscle tone. Coordination normal.  Skin: Skin is warm. No rash noted. No erythema.  Psychiatric: She has a normal mood and affect. Her behavior is normal.  Nursing note and vitals reviewed.   Ortho Exam Imaging: No results  found.  Past Medical/Family/Surgical/Social History: Medications & Allergies reviewed per EMR, new medications updated. Patient Active Problem List   Diagnosis Date Noted  . Spondylolisthesis of lumbar region 08/08/2013   Past Medical History:  Diagnosis Date  . Anxiety   . Depression   . Hypertension    History reviewed. No pertinent family history. Past Surgical History:  Procedure Laterality Date  . CARDIAC CATHETERIZATION     15 yrs ago ..clean  . CESAREAN SECTION     Social History   Occupational History  . Not on file  Tobacco Use  . Smoking status: Never Smoker  . Smokeless tobacco: Never Used  Substance and Sexual Activity  . Alcohol use: Yes    Comment: occ  . Drug use: No  . Sexual activity: Not on file

## 2018-09-09 ENCOUNTER — Other Ambulatory Visit: Payer: Self-pay | Admitting: Internal Medicine

## 2018-09-09 DIAGNOSIS — Z1231 Encounter for screening mammogram for malignant neoplasm of breast: Secondary | ICD-10-CM

## 2018-10-29 ENCOUNTER — Ambulatory Visit
Admission: RE | Admit: 2018-10-29 | Discharge: 2018-10-29 | Disposition: A | Discharge status: Home / Self Care | Payer: Medicare HMO | Source: Ambulatory Visit | Attending: Internal Medicine | Admitting: Internal Medicine

## 2018-10-29 ENCOUNTER — Other Ambulatory Visit: Payer: Self-pay

## 2018-10-29 DIAGNOSIS — Z1231 Encounter for screening mammogram for malignant neoplasm of breast: Secondary | ICD-10-CM

## 2018-11-19 ENCOUNTER — Telehealth: Payer: Self-pay | Admitting: Physical Medicine and Rehabilitation

## 2018-11-19 ENCOUNTER — Telehealth: Payer: Self-pay | Admitting: *Deleted

## 2018-11-19 NOTE — Telephone Encounter (Signed)
Left message #1

## 2018-11-19 NOTE — Telephone Encounter (Signed)
[employer], thought I had seen this earlier?

## 2018-11-19 NOTE — Telephone Encounter (Signed)
Ok

## 2018-11-22 NOTE — Telephone Encounter (Signed)
Pt is scheduled 12/07/2018 with driver.

## 2018-11-22 NOTE — Telephone Encounter (Signed)
[id number] Auth No / Request ID  Status Auto-Approved Decision Approved Effective Date 11/22/2018 Expiration Date 01/06/2019

## 2018-12-07 ENCOUNTER — Ambulatory Visit (INDEPENDENT_AMBULATORY_CARE_PROVIDER_SITE_OTHER): Payer: Medicare HMO | Admitting: Physical Medicine and Rehabilitation

## 2018-12-07 ENCOUNTER — Ambulatory Visit: Payer: Self-pay

## 2018-12-07 ENCOUNTER — Encounter: Payer: Self-pay | Admitting: Physical Medicine and Rehabilitation

## 2018-12-07 VITALS — BP 153/92 | HR 79

## 2018-12-07 DIAGNOSIS — M5416 Radiculopathy, lumbar region: Secondary | ICD-10-CM

## 2018-12-07 DIAGNOSIS — G894 Chronic pain syndrome: Secondary | ICD-10-CM

## 2018-12-07 DIAGNOSIS — M961 Postlaminectomy syndrome, not elsewhere classified: Secondary | ICD-10-CM

## 2018-12-07 DIAGNOSIS — R202 Paresthesia of skin: Secondary | ICD-10-CM | POA: Diagnosis not present

## 2018-12-07 MED ORDER — BETAMETHASONE SOD PHOS & ACET 6 (3-3) MG/ML IJ SUSP
12.0000 mg | Freq: Once | INTRAMUSCULAR | Status: AC
  Administered 2018-12-07: 16:00:00 12 mg

## 2018-12-07 NOTE — Progress Notes (Signed)
 .  Numeric Pain Rating Scale and Functional Assessment Average Pain 7   In the last MONTH (on 0-10 scale) has pain interfered with the following?  1. General activity like being  able to carry out your everyday physical activities such as walking, climbing stairs, carrying groceries, or moving a chair?  Rating(6)   +Driver, -BT, -Dye Allergies.  

## 2018-12-08 ENCOUNTER — Encounter: Payer: Self-pay | Admitting: Physical Medicine and Rehabilitation

## 2018-12-08 NOTE — Procedures (Signed)
Lumbosacral Transforaminal Epidural Steroid Injection - Sub-Pedicular Approach with Fluoroscopic Guidance  Patient: Ariel Fisher      Date of Birth: October 13, 1945 MRN: [id number] PCP: Willey Blade, MD      Visit Date: 12/07/2018   Universal Protocol:    Date/Time: 12/07/2018  Consent Given By: the patient  Position: PRONE  Additional Comments: Vital signs were monitored before and after the procedure. Patient was prepped and draped in the usual sterile fashion. The correct patient, procedure, and site was verified.   Injection Procedure Details:  Procedure Site One Meds Administered:  Meds ordered this encounter  Medications  . betamethasone acetate-betamethasone sodium phosphate (CELESTONE) injection 12 mg    Laterality: Bilateral  Location/Site:  L5-S1  Needle size: 22 G, 6 in  Needle type: Spinal  Needle Placement: Transforaminal  Findings:    -Comments: Excellent flow of contrast along the nerve and into the epidural space.  Procedure Details: After squaring off the end-plates to get a true AP view, the C-arm was positioned so that an oblique view of the foramen as noted above was visualized. The target area is just inferior to the "nose of the scotty dog" or sub pedicular. The soft tissues overlying this structure were infiltrated with 2-3 ml. of 1% Lidocaine without Epinephrine.  The spinal needle was inserted toward the target using a "trajectory" view along the fluoroscope beam.  Under AP and lateral visualization, the needle was advanced so it did not puncture dura and was located close the 6 O'Clock position of the pedical in AP tracterory. Biplanar projections were used to confirm position. Aspiration was confirmed to be negative for CSF and/or blood. A 1-2 ml. volume of Isovue-250 was injected and flow of contrast was noted at each level. Radiographs were obtained for documentation purposes.   After attaining the desired flow of contrast documented  above, a 0.5 to 1.0 ml test dose of 0.25% Marcaine was injected into each respective transforaminal space.  The patient was observed for 90 seconds post injection.  After no sensory deficits were reported, and normal lower extremity motor function was noted,   the above injectate was administered so that equal amounts of the injectate were placed at each foramen (level) into the transforaminal epidural space.   Additional Comments:  The patient tolerated the procedure well Dressing: 2 x 2 sterile gauze and Band-Aid    Post-procedure details: Patient was observed during the procedure. Post-procedure instructions were reviewed.  Patient left the clinic in stable condition.
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
## 2019-01-04 ENCOUNTER — Telehealth: Payer: Self-pay | Admitting: Radiology

## 2019-01-04 ENCOUNTER — Other Ambulatory Visit: Payer: Self-pay | Admitting: Physical Medicine and Rehabilitation

## 2019-01-04 DIAGNOSIS — G894 Chronic pain syndrome: Secondary | ICD-10-CM

## 2019-01-04 DIAGNOSIS — M961 Postlaminectomy syndrome, not elsewhere classified: Secondary | ICD-10-CM

## 2019-01-04 DIAGNOSIS — M5416 Radiculopathy, lumbar region: Secondary | ICD-10-CM

## 2019-01-04 MED ORDER — DULOXETINE HCL 30 MG PO CPEP: ORAL_CAPSULE | ORAL | 1 refills | Status: DC

## 2019-01-04 NOTE — Telephone Encounter (Signed)
Rx for duloxetine taper, directions on bottle. F/up appt needed 1 month. Medication will not help at first see if tolerates

## 2019-01-04 NOTE — Telephone Encounter (Signed)
Patient is calling requesting an rx for pain, she states that she was told to call back if it didn't help. She Bil L5-S1 TF on 12/07/2018. Please advise

## 2019-01-05 NOTE — Telephone Encounter (Signed)
I called and lmom for her to call back and sched appt for 1 month out from medication trial.

## 2019-01-07 NOTE — Telephone Encounter (Signed)
sched for 02/03/2019 @ 830

## 2019-01-31 ENCOUNTER — Other Ambulatory Visit: Payer: Self-pay | Admitting: Orthopedic Surgery

## 2019-02-03 ENCOUNTER — Ambulatory Visit (INDEPENDENT_AMBULATORY_CARE_PROVIDER_SITE_OTHER): Payer: Medicare HMO | Admitting: Physical Medicine and Rehabilitation

## 2019-02-03 ENCOUNTER — Other Ambulatory Visit: Payer: Self-pay

## 2019-02-03 ENCOUNTER — Encounter: Payer: Self-pay | Admitting: Physical Medicine and Rehabilitation

## 2019-02-03 VITALS — BP 140/83 | HR 69

## 2019-02-03 DIAGNOSIS — M961 Postlaminectomy syndrome, not elsewhere classified: Secondary | ICD-10-CM

## 2019-02-03 DIAGNOSIS — M47816 Spondylosis without myelopathy or radiculopathy, lumbar region: Secondary | ICD-10-CM

## 2019-02-03 DIAGNOSIS — G894 Chronic pain syndrome: Secondary | ICD-10-CM

## 2019-02-03 DIAGNOSIS — G8929 Other chronic pain: Secondary | ICD-10-CM

## 2019-02-03 DIAGNOSIS — M545 Low back pain: Secondary | ICD-10-CM

## 2019-02-03 NOTE — Progress Notes (Signed)
 .  Numeric Pain Rating Scale and Functional Assessment Average Pain 5   In the last MONTH (on 0-10 scale) has pain interfered with the following?  1. General activity like being  able to carry out your everyday physical activities such as walking, climbing stairs, carrying groceries, or moving a chair?  Rating(5)     

## 2019-02-04 NOTE — Patient Instructions (Addendum)
DUE TO COVID-19 ONLY ONE VISITOR IS ALLOWED TO COME WITH YOU AND STAY IN THE WAITING ROOM ONLY DURING PRE OP AND PROCEDURE DAY OF SURGERY. THE 1 VISITOR MAY VISIT WITH YOU AFTER SURGERY IN YOUR PRIVATE ROOM DURING VISITING HOURS ONLY!  YOU NEED TO HAVE A COVID 19 TEST ON 02-10-19 @ 3:05 PM, THIS TEST MUST BE DONE BEFORE SURGERY, COME  [place], [address] [place] , [id number].  ([place]) ONCE YOUR COVID TEST IS COMPLETED, PLEASE BEGIN THE QUARANTINE INSTRUCTIONS AS OUTLINED IN YOUR HANDOUT.                Jeanenne Tijana Walder  02/04/2019   Your procedure is scheduled on: 02-14-19    Report to [facility] Main  Entrance    Report to Admitting at 8:50 AM     Call this number if you have problems the morning of surgery [phone]    Remember: NO SOLID FOOD AFTER MIDNIGHT THE NIGHT PRIOR TO SURGERY. NOTHING BY MOUTH EXCEPT CLEAR LIQUIDS UNTIL 8:20 AM . PLEASE FINISH ENSURE DRINK PER SURGEON ORDER  WHICH NEEDS TO BE COMPLETED AT 8:20 AM .   CLEAR LIQUID DIET   Foods Allowed                                                                     Foods Excluded  Coffee and tea, regular and decaf                             liquids that you cannot  Plain Jell-O any favor except red or purple                                           see through such as: Fruit ices (not with fruit pulp)                                     milk, soups, orange juice  Iced Popsicles                                    All solid food Carbonated beverages, regular and diet                                    Cranberry, grape and apple juices Sports drinks like Gatorade Lightly seasoned clear broth or consume(fat free) Sugar, honey syrup  _____________________________________________________________________     Take these medicines the morning of surgery with A SIP OF WATER: None   BRUSH YOUR TEETH MORNING OF SURGERY AND RINSE YOUR MOUTH OUT, NO CHEWING GUM CANDY OR MINTS.                               You may not have any metal on your body including hair pins and  piercings  Do not wear jewelry, make-up, lotions, powders or perfumes, deodorant             Do not wear nail polish on your fingernails.  Do not shave  48 hours prior to surgery.              Do not bring valuables to the hospital. [place] IS NOT             RESPONSIBLE   FOR VALUABLES.  Contacts, dentures or bridgework may not be worn into surgery.  Leave suitcase in the car. After surgery it may be brought to your room.       Special Instructions: N/A              Please read over the following fact sheets you were given: _____________________________________________________________________             [facility] - Preparing for Surgery Before surgery, you can play an important role.  Because skin is not sterile, your skin needs to be as free of germs as possible.  You can reduce the number of germs on your skin by washing with CHG (chlorahexidine gluconate) soap before surgery.  CHG is an antiseptic cleaner which kills germs and bonds with the skin to continue killing germs even after washing. Please DO NOT use if you have an allergy to CHG or antibacterial soaps.  If your skin becomes reddened/irritated stop using the CHG and inform your nurse when you arrive at Short Stay. Do not shave (including legs and underarms) for at least 48 hours prior to the first CHG shower.  You may shave your face/neck. Please follow these instructions carefully:  1.  Shower with CHG Soap the night before surgery and the  morning of Surgery.  2.  If you choose to wash your hair, wash your hair first as usual with your  normal  shampoo.  3.  After you shampoo, rinse your hair and body thoroughly to remove the  shampoo.                           4.  Use CHG as you would any other liquid soap.  You can apply chg directly  to the skin and wash                       Gently with a scrungie or clean  washcloth.  5.  Apply the CHG Soap to your body ONLY FROM THE NECK DOWN.   Do not use on face/ open                           Wound or open sores. Avoid contact with eyes, ears mouth and genitals (private parts).                       Wash face,  Genitals (private parts) with your normal soap.             6.  Wash thoroughly, paying special attention to the area where your surgery  will be performed.  7.  Thoroughly rinse your body with warm water from the neck down.  8.  DO NOT shower/wash with your normal soap after using and rinsing off  the CHG Soap.                9.  Pat yourself  dry with a clean towel.            10.  Wear clean pajamas.            11.  Place clean sheets on your bed the night of your first shower and do not  sleep with pets. Day of Surgery : Do not apply any lotions/deodorants the morning of surgery.  Please wear clean clothes to the hospital/surgery center.  FAILURE TO FOLLOW THESE INSTRUCTIONS MAY RESULT IN THE CANCELLATION OF YOUR SURGERY PATIENT SIGNATURE_________________________________  NURSE SIGNATURE__________________________________  ________________________________________________________________________

## 2019-02-04 NOTE — Progress Notes (Signed)
PCP - Willey Blade Cardiologist -   Chest x-ray -  EKG - 02-07-19  Stress Test -  ECHO -  Cardiac Cath -   Sleep Study -  CPAP -   Fasting Blood Sugar -  Checks Blood Sugar _____ times a day  Blood Thinner Instructions: Aspirin Instructions: Last Dose:  Anesthesia review:   Patient denies shortness of breath, fever, cough and chest pain at PAT appointment   Patient verbalized understanding of instructions that were given to them at the PAT appointment. Patient was also instructed that they will need to review over the PAT instructions again at home before surgery.

## 2019-02-07 ENCOUNTER — Encounter (HOSPITAL_COMMUNITY): Payer: Self-pay

## 2019-02-07 ENCOUNTER — Other Ambulatory Visit: Payer: Self-pay

## 2019-02-07 ENCOUNTER — Encounter (HOSPITAL_COMMUNITY)
Admission: RE | Admit: 2019-02-07 | Discharge: 2019-02-07 | Disposition: A | Discharge status: Home / Self Care | Payer: Medicare HMO | Source: Ambulatory Visit | Attending: Orthopedic Surgery | Admitting: Orthopedic Surgery

## 2019-02-07 DIAGNOSIS — R9431 Abnormal electrocardiogram [ECG] [EKG]: Secondary | ICD-10-CM | POA: Diagnosis not present

## 2019-02-07 DIAGNOSIS — I10 Essential (primary) hypertension: Secondary | ICD-10-CM | POA: Insufficient documentation

## 2019-02-07 DIAGNOSIS — Z01818 Encounter for other preprocedural examination: Secondary | ICD-10-CM | POA: Diagnosis not present

## 2019-02-07 LAB — SURGICAL PCR SCREEN
MRSA, PCR: NEGATIVE
Staphylococcus aureus: NEGATIVE

## 2019-02-07 LAB — COMPREHENSIVE METABOLIC PANEL
ALT: 18 U/L (ref 0–44)
AST: 22 U/L (ref 15–41)
Albumin: 3.9 g/dL (ref 3.5–5.0)
Alkaline Phosphatase: 57 U/L (ref 38–126)
Anion gap: 9 (ref 5–15)
BUN: 12 mg/dL (ref 8–23)
CO2: 26 mmol/L (ref 22–32)
Calcium: 9 mg/dL (ref 8.9–10.3)
Chloride: 107 mmol/L (ref 98–111)
Creatinine, Ser: 0.79 mg/dL (ref 0.44–1.00)
GFR calc Af Amer: >60 mL/min (ref >60)
GFR calc non Af Amer: >60 mL/min (ref >60)
Glucose, Bld: 89 mg/dL (ref 70–99)
Potassium: 4.1 mmol/L (ref 3.5–5.1)
Sodium: 142 mmol/L (ref 135–145)
Total Bilirubin: 0.8 mg/dL (ref 0.3–1.2)
Total Protein: 7.2 g/dL (ref 6.5–8.1)

## 2019-02-07 LAB — CBC WITH DIFFERENTIAL/PLATELET
Abs Immature Granulocytes: 0.01 10*3/uL (ref 0.00–0.07)
Basophils Absolute: 0.1 10*3/uL (ref 0.0–0.1)
Basophils Relative: 1 %
Eosinophils Absolute: 0.4 10*3/uL (ref 0.0–0.5)
Eosinophils Relative: 5 %
HCT: 39.8 % (ref 36.0–46.0)
Hemoglobin: 12.5 g/dL (ref 12.0–15.0)
Immature Granulocytes: 0 %
Lymphocytes Relative: 29 %
Lymphs Abs: 1.8 10*3/uL (ref 0.7–4.0)
MCH: 30.9 pg (ref 26.0–34.0)
MCHC: 31.4 g/dL (ref 30.0–36.0)
MCV: 98.3 fL (ref 80.0–100.0)
Monocytes Absolute: 0.6 10*3/uL (ref 0.1–1.0)
Monocytes Relative: 9 %
Neutro Abs: 3.6 10*3/uL (ref 1.7–7.7)
Neutrophils Relative %: 56 %
Platelets: 409 10*3/uL — ABNORMAL HIGH (ref 150–400)
RBC: 4.05 MIL/uL (ref 3.87–5.11)
RDW: 12.6 % (ref 11.5–15.5)
WBC: 6.4 10*3/uL (ref 4.0–10.5)
nRBC: 0 % (ref 0.0–0.2)

## 2019-02-10 ENCOUNTER — Other Ambulatory Visit (HOSPITAL_COMMUNITY)
Admission: RE | Admit: 2019-02-10 | Discharge: 2019-02-10 | Disposition: A | Discharge status: Home / Self Care | Payer: Medicare HMO | Source: Ambulatory Visit | Attending: Orthopedic Surgery | Admitting: Orthopedic Surgery

## 2019-02-10 DIAGNOSIS — Z20828 Contact with and (suspected) exposure to other viral communicable diseases: Secondary | ICD-10-CM | POA: Insufficient documentation

## 2019-02-10 DIAGNOSIS — Z01812 Encounter for preprocedural laboratory examination: Secondary | ICD-10-CM | POA: Insufficient documentation

## 2019-02-10 NOTE — Progress Notes (Signed)
Anesthesia Chart Review:   Case: [id number] Date/Time: 02/14/19 1105   Procedure: TOTAL KNEE ARTHROPLASTY (Left Knee) - 75 mins needed for length of case   Anesthesia type: Spinal   Pre-op diagnosis: Primary Osteoarthritis Left Knee   Location: [facility] ROOM 06 / [facility]   Surgeon: Vickey Huger, MD      DISCUSSION:  - Pt is a 73 year old female with hx HTN.    VS: BP (!) 150/85   Pulse 74   Temp 36.8 C (Oral)   Resp 16   Ht 5\' 7"  (1.702 m)   Wt 115.7 kg   SpO2 99%   BMI 39.94 kg/m    PROVIDERS: - PCP is Willey Blade, MD   LABS: Labs reviewed: Acceptable for surgery. (all labs ordered are listed, but only abnormal results are displayed)  Labs Reviewed  CBC WITH DIFFERENTIAL/PLATELET - Abnormal; Notable for the following components:      Result Value   Platelets 409 (*)    All other components within normal limits  SURGICAL PCR SCREEN  COMPREHENSIVE METABOLIC PANEL     IMAGES:   EKG 02/07/19: NSR. Moderate voltage criteria for LVH, may be normal variant. ST & T wave abnormality, consider anterior ischemia Inferior ischemia. Appears stable dating back to 12/16/00   CV:  Cardiac cath 12/15/00:  1. LM:  Large with no significant disease. 2. LAD:  A large vessel which coursed to the apex.  There is one diagonal branch.  The LAD has no significant disease. D1 is a medium-sized vessel which bifurcated distally and had no significant disease. 3. LCX:  A large vessel which is dominant and gives rise to two OM branches as well as a PDA.  The AV circumflex has no significant disease.  The first OM is a large vessel which bifurcates distally and has no significant disease.  The second OM is a medium-sized vessel with no significant disease.  The PDA is a medium-sized vessel with no significant disease. 4. RCA:  A small vessel which is nondominant. There is a mild 30% ostial narrowing. 5. LEFT VENTRICULOGRAM:  Reveals preserved EF at 50-55%.    Past Medical History:   Diagnosis Date  . Anxiety   . Depression   . Hypertension     Past Surgical History:  Procedure Laterality Date  . CARDIAC CATHETERIZATION     15 yrs ago ..clean  . CESAREAN SECTION      MEDICATIONS: . amLODipine (NORVASC) 2.5 MG tablet  . DULoxetine (CYMBALTA) 30 MG capsule  . Flaxseed, Linseed, (FLAX SEED OIL PO)  . gabapentin (NEURONTIN) 600 MG tablet  . meloxicam (MOBIC) 15 MG tablet  . Omega-3 Fatty Acids (FISH OIL) 500 MG CAPS  . OVER THE COUNTER MEDICATION   No current facility-administered medications for this encounter.     If no changes, I anticipate pt can proceed with surgery as scheduled.   Willeen Cass, FNP-BC [facility]/Anesthesiology Phone: [phone] 02/10/2019 9:46 AM

## 2019-02-10 NOTE — Anesthesia Preprocedure Evaluation (Addendum)
Anesthesia Evaluation  Patient identified by MRN, date of birth, ID band Patient awake    Reviewed: Allergy & Precautions, NPO status , Patient's Chart, lab work & pertinent test results  Airway Mallampati: II       Dental no notable dental hx. (+) Teeth Intact   Pulmonary neg pulmonary ROS,    Pulmonary exam normal breath sounds clear to auscultation       Cardiovascular hypertension, Pt. on medications Normal cardiovascular exam Rhythm:Regular Rate:Normal     Neuro/Psych PSYCHIATRIC DISORDERS Anxiety Depression negative neurological ROS     GI/Hepatic negative GI ROS, Neg liver ROS,   Endo/Other  Morbid obesity  Renal/GU negative Renal ROS  negative genitourinary   Musculoskeletal   Abdominal (+) + obese,   Peds  Hematology negative hematology ROS (+)   Anesthesia Other Findings   Reproductive/Obstetrics                            Anesthesia Physical Anesthesia Plan  ASA: III  Anesthesia Plan: Spinal   Post-op Pain Management:  Regional for Post-op pain   Induction:   PONV Risk Score and Plan: 2 and Ondansetron and Dexamethasone  Airway Management Planned: Nasal Cannula, Natural Airway and Simple Face Mask  Additional Equipment: None  Intra-op Plan:   Post-operative Plan:   Informed Consent: I have reviewed the patients History and Physical, chart, labs and discussed the procedure including the risks, benefits and alternatives for the proposed anesthesia with the patient or authorized representative who has indicated his/her understanding and acceptance.       Plan Discussed with: CRNA  Anesthesia Plan Comments: (See APP note by Durel Salts, FNP)       Anesthesia Quick Evaluation

## 2019-02-12 LAB — NOVEL CORONAVIRUS, NAA (HOSP ORDER, SEND-OUT TO REF LAB; TAT 18-24 HRS): SARS-CoV-2, NAA: NOT DETECTED

## 2019-02-13 MED ORDER — BUPIVACAINE LIPOSOME 1.3 % IJ SUSP
20.0000 mL | Freq: Once | INTRAMUSCULAR | Status: DC
  Filled 2019-02-13: qty 20

## 2019-02-14 ENCOUNTER — Ambulatory Visit (HOSPITAL_COMMUNITY): Payer: Medicare HMO | Admitting: Physician Assistant

## 2019-02-14 ENCOUNTER — Observation Stay (HOSPITAL_COMMUNITY)
Admission: RE | Admit: 2019-02-14 | Discharge: 2019-02-15 | Disposition: A | Discharge status: Home / Self Care | Payer: Medicare HMO | Attending: Orthopedic Surgery | Admitting: Orthopedic Surgery

## 2019-02-14 ENCOUNTER — Encounter (HOSPITAL_COMMUNITY)
Admission: RE | Disposition: A | Discharge status: Home / Self Care | Payer: Self-pay | Source: Home / Self Care | Attending: Orthopedic Surgery

## 2019-02-14 ENCOUNTER — Other Ambulatory Visit: Payer: Self-pay

## 2019-02-14 ENCOUNTER — Encounter (HOSPITAL_COMMUNITY): Payer: Self-pay | Admitting: *Deleted

## 2019-02-14 DIAGNOSIS — F419 Anxiety disorder, unspecified: Secondary | ICD-10-CM | POA: Diagnosis not present

## 2019-02-14 DIAGNOSIS — M1712 Unilateral primary osteoarthritis, left knee: Secondary | ICD-10-CM | POA: Diagnosis present

## 2019-02-14 DIAGNOSIS — Z791 Long term (current) use of non-steroidal anti-inflammatories (NSAID): Secondary | ICD-10-CM | POA: Diagnosis not present

## 2019-02-14 DIAGNOSIS — F329 Major depressive disorder, single episode, unspecified: Secondary | ICD-10-CM | POA: Insufficient documentation

## 2019-02-14 DIAGNOSIS — Z96659 Presence of unspecified artificial knee joint: Secondary | ICD-10-CM

## 2019-02-14 DIAGNOSIS — Z79899 Other long term (current) drug therapy: Secondary | ICD-10-CM | POA: Insufficient documentation

## 2019-02-14 DIAGNOSIS — Z6839 Body mass index (BMI) 39.0-39.9, adult: Secondary | ICD-10-CM | POA: Diagnosis not present

## 2019-02-14 DIAGNOSIS — I10 Essential (primary) hypertension: Secondary | ICD-10-CM | POA: Insufficient documentation

## 2019-02-14 HISTORY — PX: TOTAL KNEE ARTHROPLASTY: SHX125

## 2019-02-14 SURGERY — ARTHROPLASTY, KNEE, TOTAL
Anesthesia: Spinal | Site: Knee | Laterality: Left

## 2019-02-14 MED ORDER — POVIDONE-IODINE 10 % EX SWAB
2.0000 "application " | Freq: Once | CUTANEOUS | Status: AC
  Administered 2019-02-14: 2 via TOPICAL

## 2019-02-14 MED ORDER — ALUM & MAG HYDROXIDE-SIMETH 200-200-20 MG/5ML PO SUSP: 30.0000 mL | ORAL | Status: DC | PRN

## 2019-02-14 MED ORDER — LACTATED RINGERS IV SOLN
INTRAVENOUS | Status: DC
  Administered 2019-02-14 (×2): via INTRAVENOUS

## 2019-02-14 MED ORDER — CELECOXIB 200 MG PO CAPS
400.0000 mg | ORAL_CAPSULE | Freq: Once | ORAL | Status: AC
  Administered 2019-02-14: 400 mg via ORAL
  Filled 2019-02-14: qty 2

## 2019-02-14 MED ORDER — CEFAZOLIN SODIUM-DEXTROSE 2-4 GM/100ML-% IV SOLN
2.0000 g | INTRAVENOUS | Status: AC
  Administered 2019-02-14: 12:00:00 2 g via INTRAVENOUS
  Filled 2019-02-14: qty 100

## 2019-02-14 MED ORDER — BUPIVACAINE IN DEXTROSE 0.75-8.25 % IT SOLN: INTRATHECAL | Status: DC | PRN

## 2019-02-14 MED ORDER — PROPOFOL 500 MG/50ML IV EMUL
INTRAVENOUS | Status: AC
  Filled 2019-02-14: qty 50

## 2019-02-14 MED ORDER — PROPOFOL 10 MG/ML IV BOLUS
INTRAVENOUS | Status: DC | PRN
  Administered 2019-02-14: 40 mg via INTRAVENOUS

## 2019-02-14 MED ORDER — ASPIRIN EC 325 MG PO TBEC
325.0000 mg | DELAYED_RELEASE_TABLET | Freq: Two times a day (BID) | ORAL | Status: DC
  Administered 2019-02-15: 325 mg via ORAL
  Filled 2019-02-14: qty 1

## 2019-02-14 MED ORDER — CHLORHEXIDINE GLUCONATE 4 % EX LIQD: 60.0000 mL | Freq: Once | CUTANEOUS | Status: DC

## 2019-02-14 MED ORDER — OXYCODONE HCL 5 MG PO TABS
5.0000 mg | ORAL_TABLET | ORAL | Status: DC | PRN
  Administered 2019-02-15: 5 mg via ORAL
  Filled 2019-02-14: qty 1

## 2019-02-14 MED ORDER — ONDANSETRON HCL 4 MG/2ML IJ SOLN
INTRAMUSCULAR | Status: AC
  Filled 2019-02-14: qty 2

## 2019-02-14 MED ORDER — BISACODYL 5 MG PO TBEC: 5.0000 mg | DELAYED_RELEASE_TABLET | Freq: Every day | ORAL | Status: DC | PRN

## 2019-02-14 MED ORDER — DOCUSATE SODIUM 100 MG PO CAPS
100.0000 mg | ORAL_CAPSULE | Freq: Two times a day (BID) | ORAL | Status: DC
  Administered 2019-02-14 – 2019-02-15 (×2): 100 mg via ORAL
  Filled 2019-02-14 (×2): qty 1

## 2019-02-14 MED ORDER — SODIUM CHLORIDE 0.9% FLUSH
INTRAVENOUS | Status: DC | PRN
  Administered 2019-02-14: 20 mL

## 2019-02-14 MED ORDER — FERROUS SULFATE 325 (65 FE) MG PO TABS
325.0000 mg | ORAL_TABLET | Freq: Three times a day (TID) | ORAL | Status: DC
  Administered 2019-02-15 (×2): 325 mg via ORAL
  Filled 2019-02-14 (×2): qty 1

## 2019-02-14 MED ORDER — MENTHOL 3 MG MT LOZG: 1.0000 | LOZENGE | OROMUCOSAL | Status: DC | PRN

## 2019-02-14 MED ORDER — ONDANSETRON HCL 4 MG PO TABS: 4.0000 mg | ORAL_TABLET | Freq: Four times a day (QID) | ORAL | Status: DC | PRN

## 2019-02-14 MED ORDER — DEXAMETHASONE SODIUM PHOSPHATE 10 MG/ML IJ SOLN
10.0000 mg | Freq: Once | INTRAMUSCULAR | Status: AC
  Administered 2019-02-15: 10 mg via INTRAVENOUS
  Filled 2019-02-14: qty 1

## 2019-02-14 MED ORDER — PHENOL 1.4 % MT LIQD: 1.0000 | OROMUCOSAL | Status: DC | PRN

## 2019-02-14 MED ORDER — ZOLPIDEM TARTRATE 5 MG PO TABS: 5.0000 mg | ORAL_TABLET | Freq: Every evening | ORAL | Status: DC | PRN

## 2019-02-14 MED ORDER — SODIUM CHLORIDE 0.9 % IR SOLN
Status: DC | PRN
  Administered 2019-02-14 (×2): 1000 mL

## 2019-02-14 MED ORDER — PANTOPRAZOLE SODIUM 40 MG PO TBEC
40.0000 mg | DELAYED_RELEASE_TABLET | Freq: Every day | ORAL | Status: DC
  Administered 2019-02-14 – 2019-02-15 (×2): 40 mg via ORAL
  Filled 2019-02-14 (×2): qty 1

## 2019-02-14 MED ORDER — SENNOSIDES-DOCUSATE SODIUM 8.6-50 MG PO TABS: 1.0000 | ORAL_TABLET | Freq: Every evening | ORAL | Status: DC | PRN

## 2019-02-14 MED ORDER — DIPHENHYDRAMINE HCL 12.5 MG/5ML PO ELIX: 12.5000 mg | ORAL_SOLUTION | ORAL | Status: DC | PRN

## 2019-02-14 MED ORDER — DEXAMETHASONE SODIUM PHOSPHATE 10 MG/ML IJ SOLN: 8.0000 mg | Freq: Once | INTRAMUSCULAR | Status: DC

## 2019-02-14 MED ORDER — TRANEXAMIC ACID-NACL 1000-0.7 MG/100ML-% IV SOLN
1000.0000 mg | INTRAVENOUS | Status: AC
  Administered 2019-02-14: 1000 mg via INTRAVENOUS
  Filled 2019-02-14: qty 100

## 2019-02-14 MED ORDER — DEXAMETHASONE SODIUM PHOSPHATE 10 MG/ML IJ SOLN
INTRAMUSCULAR | Status: AC
  Filled 2019-02-14: qty 1

## 2019-02-14 MED ORDER — ACETAMINOPHEN 500 MG PO TABS
1000.0000 mg | ORAL_TABLET | Freq: Four times a day (QID) | ORAL | Status: AC
  Administered 2019-02-14 – 2019-02-15 (×4): 1000 mg via ORAL
  Filled 2019-02-14 (×4): qty 2

## 2019-02-14 MED ORDER — METHOCARBAMOL 500 MG IVPB - SIMPLE MED
500.0000 mg | Freq: Four times a day (QID) | INTRAVENOUS | Status: DC | PRN
  Filled 2019-02-14: qty 50

## 2019-02-14 MED ORDER — BUPIVACAINE LIPOSOME 1.3 % IJ SUSP
INTRAMUSCULAR | Status: DC | PRN
  Administered 2019-02-14: 20 mL

## 2019-02-14 MED ORDER — ONDANSETRON HCL 4 MG/2ML IJ SOLN
INTRAMUSCULAR | Status: DC | PRN
  Administered 2019-02-14: 4 mg via INTRAVENOUS

## 2019-02-14 MED ORDER — CEFAZOLIN SODIUM-DEXTROSE 2-4 GM/100ML-% IV SOLN
2.0000 g | Freq: Four times a day (QID) | INTRAVENOUS | Status: AC
  Administered 2019-02-14 (×2): 2 g via INTRAVENOUS
  Filled 2019-02-14 (×2): qty 100

## 2019-02-14 MED ORDER — AMLODIPINE BESYLATE 5 MG PO TABS
2.5000 mg | ORAL_TABLET | Freq: Every day | ORAL | Status: DC
  Administered 2019-02-14: 2.5 mg via ORAL
  Filled 2019-02-14: qty 1

## 2019-02-14 MED ORDER — BUPIVACAINE IN DEXTROSE 0.75-8.25 % IT SOLN
INTRATHECAL | Status: DC | PRN
  Administered 2019-02-14: 1.6 mL via INTRATHECAL

## 2019-02-14 MED ORDER — DULOXETINE HCL 30 MG PO CPEP
30.0000 mg | ORAL_CAPSULE | Freq: Every day | ORAL | Status: DC
  Administered 2019-02-14: 30 mg via ORAL
  Filled 2019-02-14: qty 1

## 2019-02-14 MED ORDER — DEXAMETHASONE SODIUM PHOSPHATE 10 MG/ML IJ SOLN
INTRAMUSCULAR | Status: DC | PRN
  Administered 2019-02-14: 10 mg via INTRAVENOUS

## 2019-02-14 MED ORDER — BUPIVACAINE HCL (PF) 0.25 % IJ SOLN
INTRAMUSCULAR | Status: DC | PRN
  Administered 2019-02-14: 20 mL

## 2019-02-14 MED ORDER — PROPOFOL 500 MG/50ML IV EMUL
INTRAVENOUS | Status: DC | PRN
  Administered 2019-02-14: 65 ug/kg/min via INTRAVENOUS

## 2019-02-14 MED ORDER — GABAPENTIN 300 MG PO CAPS
300.0000 mg | ORAL_CAPSULE | Freq: Once | ORAL | Status: AC
  Administered 2019-02-14: 300 mg via ORAL
  Filled 2019-02-14: qty 1

## 2019-02-14 MED ORDER — METOCLOPRAMIDE HCL 5 MG/ML IJ SOLN: 5.0000 mg | Freq: Three times a day (TID) | INTRAMUSCULAR | Status: DC | PRN

## 2019-02-14 MED ORDER — TRANEXAMIC ACID-NACL 1000-0.7 MG/100ML-% IV SOLN
1000.0000 mg | Freq: Once | INTRAVENOUS | Status: AC
  Administered 2019-02-14: 1000 mg via INTRAVENOUS
  Filled 2019-02-14: qty 100

## 2019-02-14 MED ORDER — FENTANYL CITRATE (PF) 100 MCG/2ML IJ SOLN
50.0000 ug | Freq: Once | INTRAMUSCULAR | Status: AC
  Administered 2019-02-14: 100 ug via INTRAVENOUS
  Filled 2019-02-14: qty 2

## 2019-02-14 MED ORDER — ONDANSETRON HCL 4 MG/2ML IJ SOLN: 4.0000 mg | Freq: Four times a day (QID) | INTRAMUSCULAR | Status: DC | PRN

## 2019-02-14 MED ORDER — KETOROLAC TROMETHAMINE 30 MG/ML IJ SOLN: 30.0000 mg | Freq: Once | INTRAMUSCULAR | Status: DC | PRN

## 2019-02-14 MED ORDER — GABAPENTIN 300 MG PO CAPS
300.0000 mg | ORAL_CAPSULE | Freq: Three times a day (TID) | ORAL | Status: DC
  Administered 2019-02-14 – 2019-02-15 (×2): 300 mg via ORAL
  Filled 2019-02-14 (×2): qty 1

## 2019-02-14 MED ORDER — MEPERIDINE HCL 50 MG/ML IJ SOLN: 6.2500 mg | INTRAMUSCULAR | Status: DC | PRN

## 2019-02-14 MED ORDER — SODIUM CHLORIDE 0.9 % IV SOLN
INTRAVENOUS | Status: DC
  Administered 2019-02-14: 18:00:00 via INTRAVENOUS

## 2019-02-14 MED ORDER — SODIUM CHLORIDE (PF) 0.9 % IJ SOLN
INTRAMUSCULAR | Status: AC
  Filled 2019-02-14: qty 20

## 2019-02-14 MED ORDER — PROMETHAZINE HCL 25 MG/ML IJ SOLN: 6.2500 mg | INTRAMUSCULAR | Status: DC | PRN

## 2019-02-14 MED ORDER — ACETAMINOPHEN 500 MG PO TABS
1000.0000 mg | ORAL_TABLET | Freq: Once | ORAL | Status: AC
  Administered 2019-02-14: 1000 mg via ORAL
  Filled 2019-02-14: qty 2

## 2019-02-14 MED ORDER — METHOCARBAMOL 500 MG PO TABS
500.0000 mg | ORAL_TABLET | Freq: Four times a day (QID) | ORAL | Status: DC | PRN
  Administered 2019-02-14 – 2019-02-15 (×2): 500 mg via ORAL
  Filled 2019-02-14 (×2): qty 1

## 2019-02-14 MED ORDER — BUPIVACAINE HCL (PF) 0.25 % IJ SOLN
INTRAMUSCULAR | Status: AC
  Filled 2019-02-14: qty 30

## 2019-02-14 MED ORDER — METOCLOPRAMIDE HCL 5 MG PO TABS: 5.0000 mg | ORAL_TABLET | Freq: Three times a day (TID) | ORAL | Status: DC | PRN

## 2019-02-14 MED ORDER — MIDAZOLAM HCL 2 MG/2ML IJ SOLN
1.0000 mg | Freq: Once | INTRAMUSCULAR | Status: AC
  Administered 2019-02-14: 2 mg via INTRAVENOUS
  Filled 2019-02-14: qty 2

## 2019-02-14 MED ORDER — HYDROMORPHONE HCL 1 MG/ML IJ SOLN: 0.2500 mg | INTRAMUSCULAR | Status: DC | PRN

## 2019-02-14 MED ORDER — HYDROMORPHONE HCL 1 MG/ML IJ SOLN: 0.5000 mg | INTRAMUSCULAR | Status: DC | PRN

## 2019-02-14 MED ORDER — FLEET ENEMA 7-19 GM/118ML RE ENEM: 1.0000 | ENEMA | Freq: Once | RECTAL | Status: DC | PRN

## 2019-02-14 SURGICAL SUPPLY — 63 items
ARTISURF 10M PLY L 6-9CD KNEE (Knees) ×1 IMPLANT
BAG SPEC THK2 15X12 ZIP CLS (MISCELLANEOUS) ×1
BAG ZIPLOCK 12X15 (MISCELLANEOUS) ×2 IMPLANT
BLADE PATELLA REAM PILOT HOLE (MISCELLANEOUS) ×1 IMPLANT
BLADE SAGITTAL 13X1.27X60 (BLADE) ×2 IMPLANT
BLADE SAW SGTL 83.5X18.5 (BLADE) ×2 IMPLANT
BLADE SURG 15 STRL LF DISP TIS (BLADE) ×1 IMPLANT
BLADE SURG 15 STRL SS (BLADE) ×2
BLADE SURG SZ10 CARB STEEL (BLADE) ×4 IMPLANT
BNDG ELASTIC 6X5.8 VLCR STR LF (GAUZE/BANDAGES/DRESSINGS) ×2 IMPLANT
BOWL SMART MIX CTS (DISPOSABLE) ×2 IMPLANT
BSPLAT TIB 5D D CMNT STM LT (Knees) ×1 IMPLANT
CEMENT BONE SIMPLEX SPEEDSET (Cement) ×4 IMPLANT
COVER SURGICAL LIGHT HANDLE (MISCELLANEOUS) ×2 IMPLANT
COVER WAND RF STERILE (DRAPES) IMPLANT
CUFF TOURN SGL QUICK 34 (TOURNIQUET CUFF) ×2
CUFF TRNQT CYL 34X4.125X (TOURNIQUET CUFF) ×1 IMPLANT
DECANTER SPIKE VIAL GLASS SM (MISCELLANEOUS) ×4 IMPLANT
DRAPE INCISE IOBAN 66X45 STRL (DRAPES) ×4 IMPLANT
DRAPE U-SHAPE 47X51 STRL (DRAPES) ×2 IMPLANT
DRESSING AQUACEL AG SP 3.5X10 (GAUZE/BANDAGES/DRESSINGS) IMPLANT
DRSG AQUACEL AG ADV 3.5X10 (GAUZE/BANDAGES/DRESSINGS) ×2 IMPLANT
DRSG AQUACEL AG SP 3.5X10 (GAUZE/BANDAGES/DRESSINGS) ×2
DURAPREP 26ML APPLICATOR (WOUND CARE) ×4 IMPLANT
ELECT REM PT RETURN 15FT ADLT (MISCELLANEOUS) ×2 IMPLANT
FEMUR  CMT CCR STD SZ8 L KNEE (Knees) ×1 IMPLANT
FEMUR CMT CCR STD SZ8 L KNEE (Knees) ×1 IMPLANT
FEMUR CMTD CCR STD SZ8 L KNEE (Knees) IMPLANT
GLOVE BIOGEL M STRL SZ7.5 (GLOVE) ×2 IMPLANT
GLOVE BIOGEL PI IND STRL 7.5 (GLOVE) ×1 IMPLANT
GLOVE BIOGEL PI IND STRL 8.5 (GLOVE) ×2 IMPLANT
GLOVE BIOGEL PI INDICATOR 7.5 (GLOVE) ×1
GLOVE BIOGEL PI INDICATOR 8.5 (GLOVE) ×2
GLOVE SURG ORTHO 8.0 STRL STRW (GLOVE) ×6 IMPLANT
GOWN STRL REUS W/ TWL XL LVL3 (GOWN DISPOSABLE) ×2 IMPLANT
GOWN STRL REUS W/TWL XL LVL3 (GOWN DISPOSABLE) ×4
HANDPIECE INTERPULSE COAX TIP (DISPOSABLE) ×2
HOLDER FOLEY CATH W/STRAP (MISCELLANEOUS) ×2 IMPLANT
HOOD PEEL AWAY FLYTE STAYCOOL (MISCELLANEOUS) ×6 IMPLANT
KIT TURNOVER KIT A (KITS) IMPLANT
MANIFOLD NEPTUNE II (INSTRUMENTS) ×2 IMPLANT
NEEDLE HYPO 22GX1.5 SAFETY (NEEDLE) ×2 IMPLANT
NS IRRIG 1000ML POUR BTL (IV SOLUTION) ×2 IMPLANT
PACK TOTAL KNEE CUSTOM (KITS) ×2 IMPLANT
PROTECTOR NERVE ULNAR (MISCELLANEOUS) ×2 IMPLANT
SET HNDPC FAN SPRY TIP SCT (DISPOSABLE) ×1 IMPLANT
STEM POLY PAT PLY 32M KNEE (Knees) ×1 IMPLANT
STEM TIBIA 5 DEG SZ D L KNEE (Knees) IMPLANT
STRIP CLOSURE SKIN 1/2X4 (GAUZE/BANDAGES/DRESSINGS) ×3 IMPLANT
SUT BONE WAX W31G (SUTURE) ×2 IMPLANT
SUT MNCRL AB 3-0 PS2 18 (SUTURE) ×2 IMPLANT
SUT STRATAFIX 0 PDS 27 VIOLET (SUTURE) ×2
SUT STRATAFIX PDS+ 0 24IN (SUTURE) ×2 IMPLANT
SUT VIC AB 1 CT1 36 (SUTURE) ×2 IMPLANT
SUTURE STRATFX 0 PDS 27 VIOLET (SUTURE) ×1 IMPLANT
SYR CONTROL 10ML LL (SYRINGE) ×4 IMPLANT
TIBIA STEM 5 DEG SZ D L KNEE (Knees) ×2 IMPLANT
TRAY FOLEY MTR SLVR 14FR STAT (SET/KITS/TRAYS/PACK) ×1 IMPLANT
TRAY FOLEY MTR SLVR 16FR STAT (SET/KITS/TRAYS/PACK) ×1 IMPLANT
TROCAR PINS ×1 IMPLANT
WATER STERILE IRR 1000ML POUR (IV SOLUTION) ×4 IMPLANT
WRAP KNEE MAXI GEL POST OP (GAUZE/BANDAGES/DRESSINGS) ×2 IMPLANT
YANKAUER SUCT BULB TIP 10FT TU (MISCELLANEOUS) ×2 IMPLANT

## 2019-02-14 NOTE — Transfer of Care (Signed)
Immediate Anesthesia Transfer of Care Note  Patient: Ariel Fisher  Procedure(s) Performed: TOTAL KNEE ARTHROPLASTY (Left Knee)  Patient Location: PACU  Anesthesia Type:Spinal  Level of Consciousness: awake, alert  and oriented  Airway & Oxygen Therapy: Patient Spontanous Breathing and Patient connected to face mask oxygen  Post-op Assessment: Report given to RN and Post -op Vital signs reviewed and stable  Post vital signs: Reviewed and stable  Last Vitals:  Vitals Value Taken Time  BP    Temp    Pulse 67 02/14/19 1346  Resp 14 02/14/19 1346  SpO2 100 % 02/14/19 1346  Vitals shown include unvalidated device data.  Last Pain:  Vitals:   02/14/19 1147  TempSrc:   PainSc: 0-No pain      Patients Stated Pain Goal: 4 (44/01/02 7253)  Complications: No apparent anesthesia complications

## 2019-02-14 NOTE — Evaluation (Signed)
Physical Therapy Evaluation Patient Details Name: Ariel Fisher MRN: [id number] DOB: 03-19-1946 Today's Date: 02/14/2019   History of Present Illness  s/p L TKA. Hx: PLIF  Clinical Impression  Pt is s/p TKA resulting in the deficits listed below (see PT Problem List).  Pt limited by L knee buckling, effects of adductor  Block (with anesthesia ending at 1348).  Limited to lateral steps along EOB with +2 assist; will see again in am  Pt will benefit from skilled PT to increase their independence and safety with mobility to allow discharge to the venue listed below.      Follow Up Recommendations Follow surgeon's recommendation for DC plan and follow-up therapies    Equipment Recommendations  None recommended by PT    Recommendations for Other Services       Precautions / Restrictions Precautions Precautions: Fall;Knee Restrictions Weight Bearing Restrictions: No      Mobility  Bed Mobility Overal bed mobility: Needs Assistance Bed Mobility: Supine to Sit;Sit to Supine     Supine to sit: Min assist Sit to supine: Min assist   General bed mobility comments: assist with LLE in both directions,  cues for technique  Transfers Overall transfer level: Needs assistance Equipment used: Rolling walker (2 wheeled) Transfers: Sit to/from Stand Sit to Stand: Min assist;+2 physical assistance;+2 safety/equipment         General transfer comment: repeated x2, on first attempt pt knees buckled and lowered back to bed by PT and NT; on second attempt pt able to take lateral steps with L knee support from PT and +2 assist for safety; cues for hand placement, posture, use of UEs  Ambulation/Gait             General Gait Details: lateral steps only d/t knee buckling  Stairs            Wheelchair Mobility    Modified Rankin (Stroke Patients Only)       Balance                                             Pertinent Vitals/Pain Pain  Assessment: 0-10 Pain Score: 3  Pain Location: L knee Pain Descriptors / Indicators: Grimacing;Sore Pain Intervention(s): Premedicated before session;Monitored during session;Limited activity within patient's tolerance;Repositioned    Home Living Family/patient expects to be discharged to:: Private residence Living Arrangements: Spouse/significant other Available Help at Discharge: Family;Available 24 hours/day Type of Home: House Home Access: Level entry;Stairs to enter Entrance Stairs-Rails: None Entrance Stairs-Number of Steps: 3 Home Layout: One level Home Equipment: Walker - 2 wheels;Bedside commode;Shower seat      Prior Function Level of Independence: Independent               Hand Dominance        Extremity/Trunk Assessment   Upper Extremity Assessment Upper Extremity Assessment: Overall WFL for tasks assessed    Lower Extremity Assessment Lower Extremity Assessment: LLE deficits/detail LLE Deficits / Details: ankle grossly WFL with sensation intact; L knee with ~15 degree quad lag, strength 2+/5       Communication   Communication: No difficulties  Cognition Arousal/Alertness: Awake/alert Behavior During Therapy: WFL for tasks assessed/performed Overall Cognitive Status: Within Functional Limits for tasks assessed  General Comments      Exercises Total Joint Exercises Ankle Circles/Pumps: AROM;Both;10 reps Quad Sets: 10 reps;Both;AROM   Assessment/Plan    PT Assessment Patient needs continued PT services  PT Problem List Decreased strength;Decreased range of motion;Decreased activity tolerance;Pain;Decreased knowledge of use of DME;Decreased mobility       PT Treatment Interventions DME instruction;Gait training;Functional mobility training;Stair training;Therapeutic exercise;Patient/family education;Therapeutic activities    PT Goals (Current goals can be found in the Care Plan section)   Acute Rehab PT Goals PT Goal Formulation: With patient Time For Goal Achievement: 02/20/19 Potential to Achieve Goals: Good    Frequency 7X/week   Barriers to discharge        Co-evaluation               AM-PAC PT "6 Clicks" Mobility  Outcome Measure Help needed turning from your back to your side while in a flat bed without using bedrails?: A Little Help needed moving from lying on your back to sitting on the side of a flat bed without using bedrails?: A Little Help needed moving to and from a bed to a chair (including a wheelchair)?: Total Help needed standing up from a chair using your arms (e.g., wheelchair or bedside chair)?: Total Help needed to walk in hospital room?: Total Help needed climbing 3-5 steps with a railing? : Total 6 Click Score: 10    End of Session Equipment Utilized During Treatment: Gait belt Activity Tolerance: Patient tolerated treatment well;Other (comment)(limited by knee buckling/effects of block) Patient left: in bed;with call bell/phone within reach;with bed alarm set Nurse Communication: Mobility status PT Visit Diagnosis: Difficulty in walking, not elsewhere classified (R26.2)    Time: 7342-8768 PT Time Calculation (min) (ACUTE ONLY): 24 min   Charges:   PT Evaluation $PT Eval Low Complexity: 1 Low PT Treatments $Therapeutic Activity: 8-22 mins        Drucilla Chalet, PT  Pager: [phone] Acute Rehab Dept [facility]): [phone]   02/14/2019  [facility] 02/14/2019, 7:30 PM

## 2019-02-14 NOTE — Anesthesia Procedure Notes (Signed)
Spinal  Patient location during procedure: OR Start time: 02/14/2019 12:14 PM End time: 02/14/2019 12:16 PM Staffing Anesthesiologist: Lyn Hollingshead, MD Performed: anesthesiologist  Preanesthetic Checklist Completed: patient identified, site marked, surgical consent, pre-op evaluation, timeout performed, IV checked, risks and benefits discussed and monitors and equipment checked Spinal Block Patient position: sitting Prep: site prepped and draped and DuraPrep Patient monitoring: continuous pulse ox and blood pressure Approach: midline Location: L3-4 Injection technique: single-shot Needle Needle type: Pencan  Needle gauge: 24 G Needle length: 10 cm Needle insertion depth: 7 cm Assessment Sensory level: T8

## 2019-02-14 NOTE — Anesthesia Postprocedure Evaluation (Signed)
Anesthesia Post Note  Patient: Ariel Fisher  Procedure(s) Performed: TOTAL KNEE ARTHROPLASTY (Left Knee)     Patient location during evaluation: PACU Anesthesia Type: Spinal Level of consciousness: awake Pain management: pain level controlled Vital Signs Assessment: post-procedure vital signs reviewed and stable Respiratory status: spontaneous breathing Cardiovascular status: stable Postop Assessment: no headache, no backache, spinal receding and no apparent nausea or vomiting Anesthetic complications: no    Last Vitals:  Vitals:   02/14/19 1430 02/14/19 1445  BP: 123/88 119/87  Pulse: 61 69  Resp: 11 13  Temp:    SpO2: 100% 97%    Last Pain:  Vitals:   02/14/19 1415  TempSrc:   PainSc: 0-No pain   Pain Goal: Patients Stated Pain Goal: 4 (02/14/19 1132)  LLE Motor Response: Purposeful movement (02/14/19 1415) LLE Sensation: Numbness (02/14/19 1415) RLE Motor Response: Purposeful movement (02/14/19 1415) RLE Sensation: Numbness (02/14/19 1415) L Sensory Level: L3-Anterior knee, lower leg (02/14/19 1415) R Sensory Level: L3-Anterior knee, lower leg (02/14/19 1415) Epidural/Spinal Function Cutaneous sensation: Vague (02/14/19 1415), Patient able to flex knees: No (02/14/19 1415), Patient able to lift hips off bed: No (02/14/19 1415)  Huston Foley

## 2019-02-14 NOTE — Plan of Care (Signed)

## 2019-02-14 NOTE — Progress Notes (Signed)
Assisted Dr. Hatchett with left, ultrasound guided, adductor canal block. Side rails up, monitors on throughout procedure. See vital signs in flow sheet. Tolerated Procedure well. 

## 2019-02-14 NOTE — Op Note (Signed)
TOTAL KNEE REPLACEMENT OPERATIVE NOTE:  02/14/2019  1:48 PM  PATIENT:  Ariel Fisher  73 y.o. female  PRE-OPERATIVE DIAGNOSIS:  Primary Osteoarthritis Left Knee  POST-OPERATIVE DIAGNOSIS:  Primary Osteoarthritis Left Knee  PROCEDURE:  Procedure(s): TOTAL KNEE ARTHROPLASTY  SURGEON:  Surgeon(s): Dannielle Huh, MD  PHYSICIAN ASSISTANT: Laurier Nancy, PA-C   ANESTHESIA:   spinal  SPECIMEN: None  COUNTS:  Correct  TOURNIQUET:   Total Tourniquet Time Documented: Thigh (Left) - 40 minutes Total: Thigh (Left) - 40 minutes   DICTATION:  Indication for procedure:    The patient is a 73 y.o. female who has failed conservative treatment for Primary Osteoarthritis Left Knee.  Informed consent was obtained prior to anesthesia. The risks versus benefits of the operation were explain and in a way the patient can, and did, understand.    Description of procedure:     The patient was taken to the operating room and placed under anesthesia.  The patient was positioned in the usual fashion taking care that all body parts were adequately padded and/or protected.  A tourniquet was applied and the leg prepped and draped in the usual sterile fashion.  The extremity was exsanguinated with the esmarch and tourniquet inflated to 350 mmHg.  Pre-operative range of motion was normal.   A midline incision approximately 6-7 inches long was made with a #10 blade.  A new blade was used to make a parapatellar arthrotomy going 2-3 cm into the quadriceps tendon, over the patella, and alongside the medial aspect of the patellar tendon.  A synovectomy was then performed with the #10 blade and forceps. I then elevated the deep MCL off the medial tibial metaphysis subperiosteally around to the semimembranosus attachment.    I everted the patella and used calipers to measure patellar thickness.  I used the reamer to ream down to appropriate thickness to recreate the native thickness.  I then removed excess  bone with the rongeur and sagittal saw.  I used the appropriately sized template and drilled the three lug holes.  I then put the trial in place and measured the thickness with the calipers to ensure recreation of the native thickness.  The trial was then removed and the patella subluxed and the knee brought into flexion.  A homan retractor was place to retract and protect the patella and lateral structures.  A Z-retractor was place medially to protect the medial structures.  The extra-medullary alignment system was used to make cut the tibial articular surface perpendicular to the anamotic axis of the tibia and in 3 degrees of posterior slope.  The cut surface and alignment jig was removed.  I then used the intramedullary alignment guide to make a  valgus cut on the distal femur.  I then marked out the epicondylar axis on the distal femur.   I then used the anterior referencing sizer and measured the femur to be a size 8.  The 4-In-1 cutting block was screwed into place in external rotation matching the posterior condylar angle, making our cuts perpendicular to the epicondylar axis.  Anterior, posterior and chamfer cuts were made with the sagittal saw.  The cutting block and cut pieces were removed.  A lamina spreader was placed in 90 degrees of flexion.  The ACL, PCL, menisci, and posterior condylar osteophytes were removed.  A 10 mm spacer blocked was found to offer good flexion and extension gap balance after minimal in degree releasing.   The scoop retractor was then placed and the  femoral finishing block was pinned in place.  The small sagittal saw was used as well as the lug drill to finish the femur.  The block and cut surfaces were removed and the medullary canal hole filled with autograft bone from the cut pieces.  The tibia was delivered forward in deep flexion and external rotation.  A size D tray was selected and pinned into place centered on the medial 1/3 of the tibial tubercle.  The reamer  and keel was used to prepare the tibia through the tray.    I then trialed with the size 8 femur, size D tibia, a 10 mm insert and the 32 patella.  I had excellent flexion/extension gap balance, excellent patella tracking.  Flexion was full and beyond 120 degrees; extension was zero.  These components were chosen and the staff opened them to me on the back table while the knee was lavaged copiously and the cement mixed.  The soft tissue was infiltrated with 60cc of exparel 1.3% through a 21 gauge needle.  I cemented in the components and removed all excess cement.  The polyethylene tibial component was snapped into place and the knee placed in extension while cement was hardening.  The capsule was infilltrated with a 60cc exparel/marcaine/saline mixture.   Once the cement was hard, the tourniquet was let down.  Hemostasis was obtained.  The arthrotomy was closed using a #1 stratofix running suture.  The deep soft tissues were closed with #0 vicryls and the subcuticular layer closed with #2-0 vicryl.  The skin was reapproximated and closed with 3.0 Monocryl.  The wound was covered with steristrips, aquacel dressing, and a TED stocking.   The patient was then awakened, extubated, and taken to the recovery room in stable condition.  BLOOD LOSS:  810FB COMPLICATIONS:  None.  PLAN OF CARE: Admit for overnight observation  PATIENT DISPOSITION:  PACU - hemodynamically stable.    Please fax a copy of this op note to my office at [phone] (please only include page 1 and 2 of the Case Information op note)

## 2019-02-14 NOTE — H&P (Signed)
Ariel Fisher MRN:  [id number] DOB/SEX:  December 03, 1945/female  CHIEF COMPLAINT:  Painful left Knee  HISTORY: Patient is a 73 y.o. female presented with a history of pain in the left knee. Onset of symptoms was gradual starting a few years ago with gradually worsening course since that time. Patient has been treated conservatively with over-the-counter NSAIDs and activity modification. Patient currently rates pain in the knee at 10 out of 10 with activity. There is pain at night.  PAST MEDICAL HISTORY: Patient Active Problem List   Diagnosis Date Noted  . Spondylolisthesis of lumbar region 08/08/2013   Past Medical History:  Diagnosis Date  . Anxiety   . Depression   . Hypertension    Past Surgical History:  Procedure Laterality Date  . CARDIAC CATHETERIZATION     15 yrs ago ..clean  . CESAREAN SECTION       MEDICATIONS:   Medications Prior to Admission  Medication Sig Dispense Refill Last Dose  . amLODipine (NORVASC) 2.5 MG tablet Take 2.5 mg by mouth at bedtime.      . DULoxetine (CYMBALTA) 30 MG capsule Take 1 cap by mouth at night for 7 nights then take 2 capsules daily at night (Patient not taking: Reported on 02/07/2019) 60 capsule 1   . Flaxseed, Linseed, (FLAX SEED OIL PO) Take 1 tablet by mouth daily.     Marland Kitchen gabapentin (NEURONTIN) 600 MG tablet Take 600 mg by mouth at bedtime.     . meloxicam (MOBIC) 15 MG tablet Take 15 mg by mouth daily.     . Omega-3 Fatty Acids (FISH OIL) 500 MG CAPS Take 500 mg by mouth daily.      Marland Kitchen OVER THE COUNTER MEDICATION Take 1 tablet by mouth daily. Keto supplement       ALLERGIES:   Allergies  Allergen Reactions  . Tramadol Other (See Comments)    Hives, nausea, dizziness    REVIEW OF SYSTEMS:  A comprehensive review of systems was negative except for: Musculoskeletal: positive for arthralgias and bone pain   FAMILY HISTORY:  No family history on file.  SOCIAL HISTORY:   Social History   Tobacco Use  . Smoking status:  Never Smoker  . Smokeless tobacco: Never Used  Substance Use Topics  . Alcohol use: Yes    Comment: occ     EXAMINATION:  Vital signs in last 24 hours:    There were no vitals taken for this visit.  General Appearance:    Alert, cooperative, no distress, appears stated age  Head:    Normocephalic, without obvious abnormality, atraumatic  Eyes:    PERRL, conjunctiva/corneas clear, EOM's intact, fundi    benign, both eyes  Ears:    Normal TM's and external ear canals, both ears  Nose:   Nares normal, septum midline, mucosa normal, no drainage    or sinus tenderness  Throat:   Lips, mucosa, and tongue normal; teeth and gums normal  Neck:   Supple, symmetrical, trachea midline, no adenopathy;    thyroid:  no enlargement/tenderness/nodules; no carotid   bruit or JVD  Back:     Symmetric, no curvature, ROM normal, no CVA tenderness  Lungs:     Clear to auscultation bilaterally, respirations unlabored  Chest Wall:    No tenderness or deformity   Heart:    Regular rate and rhythm, S1 and S2 normal, no murmur, rub   or gallop  Breast Exam:    No tenderness, masses, or nipple abnormality  Abdomen:  Soft, non-tender, bowel sounds active all four quadrants,    no masses, no organomegaly  Genitalia:    Normal female without lesion, discharge or tenderness  Rectal:    Normal tone, no masses or tenderness;   guaiac negative stool  Extremities:   Extremities normal, atraumatic, no cyanosis or edema  Pulses:   2+ and symmetric all extremities  Skin:   Skin color, texture, turgor normal, no rashes or lesions  Lymph nodes:   Cervical, supraclavicular, and axillary nodes normal  Neurologic:   CNII-XII intact, normal strength, sensation and reflexes    throughout    Musculoskeletal:  ROM 0-120, Ligaments intact,  Imaging Review Plain radiographs demonstrate severe degenerative joint disease of the left knee. The overall alignment is neutral. The bone quality appears to be good for age and  reported activity level.  Assessment/Plan: Primary osteoarthritis, left knee   The patient history, physical examination and imaging studies are consistent with advanced degenerative joint disease of the left knee. The patient has failed conservative treatment.  The clearance notes were reviewed.  After discussion with the patient it was felt that Total Knee Replacement was indicated. The procedure,  risks, and benefits of total knee arthroplasty were presented and reviewed. The risks including but not limited to aseptic loosening, infection, blood clots, vascular injury, stiffness, patella tracking problems complications among others were discussed. The patient acknowledged the explanation, agreed to proceed with the plan.  Preoperative templating of the joint replacement has been completed, documented, and submitted to the Operating Room personnel in order to optimize intra-operative equipment management.    Patient's anticipated LOS is less than 2 midnights, meeting these requirements: - Lives within 1 hour of care - Has a competent adult at home to recover with post-op recover - NO history of  - Chronic pain requiring opiods  - Diabetes  - Coronary Artery Disease  - Heart failure  - Heart attack  - Stroke  - DVT/VTE  - Cardiac arrhythmia  - Respiratory Failure/COPD  - Renal failure  - Anemia  - Advanced Liver disease       Donia Ast 02/14/2019, 9:02 AM

## 2019-02-15 DIAGNOSIS — M1712 Unilateral primary osteoarthritis, left knee: Secondary | ICD-10-CM | POA: Diagnosis not present

## 2019-02-15 LAB — BASIC METABOLIC PANEL
Anion gap: 8 (ref 5–15)
BUN: 17 mg/dL (ref 8–23)
CO2: 22 mmol/L (ref 22–32)
Calcium: 8.6 mg/dL — ABNORMAL LOW (ref 8.9–10.3)
Chloride: 109 mmol/L (ref 98–111)
Creatinine, Ser: 0.72 mg/dL (ref 0.44–1.00)
GFR calc Af Amer: >60 mL/min (ref >60)
GFR calc non Af Amer: >60 mL/min (ref >60)
Glucose, Bld: 119 mg/dL — ABNORMAL HIGH (ref 70–99)
Potassium: 4 mmol/L (ref 3.5–5.1)
Sodium: 139 mmol/L (ref 135–145)

## 2019-02-15 LAB — CBC
HCT: 37.7 % (ref 36.0–46.0)
Hemoglobin: 11.5 g/dL — ABNORMAL LOW (ref 12.0–15.0)
MCH: 30.6 pg (ref 26.0–34.0)
MCHC: 30.5 g/dL (ref 30.0–36.0)
MCV: 100.3 fL — ABNORMAL HIGH (ref 80.0–100.0)
Platelets: 403 10*3/uL — ABNORMAL HIGH (ref 150–400)
RBC: 3.76 MIL/uL — ABNORMAL LOW (ref 3.87–5.11)
RDW: 12.8 % (ref 11.5–15.5)
WBC: 16.3 10*3/uL — ABNORMAL HIGH (ref 4.0–10.5)
nRBC: 0 % (ref 0.0–0.2)

## 2019-02-15 MED ORDER — ASPIRIN 325 MG PO TBEC: 325.0000 mg | DELAYED_RELEASE_TABLET | Freq: Two times a day (BID) | ORAL | 0 refills | Status: DC

## 2019-02-15 MED ORDER — OXYCODONE HCL 5 MG PO TABS: 5.0000 mg | ORAL_TABLET | Freq: Four times a day (QID) | ORAL | 0 refills | Status: DC | PRN

## 2019-02-15 MED ORDER — METHOCARBAMOL 500 MG PO TABS: 500.0000 mg | ORAL_TABLET | Freq: Four times a day (QID) | ORAL | 0 refills | Status: DC | PRN

## 2019-02-15 NOTE — Progress Notes (Signed)
SPORTS MEDICINE AND JOINT REPLACEMENT  Georgena Spurling, MD    Laurier Nancy, PA-C [address] [place], [place], [place]  [address]                             [phone]   PROGRESS NOTE  Subjective:  negative for Chest Pain  negative for Shortness of Breath  negative for Nausea/Vomiting   negative for Calf Pain  negative for Bowel Movement   Tolerating Diet: yes         Patient reports pain as 3 on 0-10 scale.    Objective: Vital signs in last 24 hours:    Patient Vitals for the past 24 hrs:  BP Temp Temp src Pulse Resp SpO2 Height Weight  02/15/19 0553 115/72 97.8 F (36.6 C) Oral 66 14 99 % - -  02/15/19 0143 119/69 98.1 F (36.7 C) Oral 64 16 96 % - -  02/14/19 2159 115/63 97.7 F (36.5 C) Oral 71 16 98 % - -  02/14/19 1637 (!) 125/91 - - 81 15 100 % - -  02/14/19 1523 117/86 (!) 97.5 F (36.4 C) Oral 68 - 100 % - -  02/14/19 1520 - - - - - - 5\' 7"  (1.702 m) 115.2 kg  02/14/19 1509 117/88 - - 69 14 94 % - -  02/14/19 1500 124/90 - - 69 11 96 % - -  02/14/19 1445 119/87 - - 69 13 97 % - -  02/14/19 1430 123/88 - - 61 11 100 % - -  02/14/19 1415 112/80 - - (!) 55 12 100 % - -  02/14/19 1400 102/68 - - 60 13 99 % - -  02/14/19 1345 100/72 (!) 97.5 F (36.4 C) - 67 14 100 % - -  02/14/19 1147 133/83 - - 65 13 100 % - -  02/14/19 1142 (!) 133/91 - - 68 10 96 % - -  02/14/19 1137 (!) 152/92 - - 61 14 100 % - -  02/14/19 1132 (!) 139/96 - - 76 17 95 % - -  02/14/19 0902 140/87 98.1 F (36.7 C) Oral 84 16 98 % - -    @flow {1959:LAST@   Intake/Output from previous day:   10/26 0701 - 10/27 0700 In: 3038.7 [P.O.:780; I.V.:1958.7] Out: 1050 [Urine:1000]   Intake/Output this shift:   No intake/output data recorded.   Intake/Output      10/26 0701 - 10/27 0700 10/27 0701 - 10/28 0700   P.O. 780    I.V. (mL/kg) 1958.7 (17)    IV Piggyback 300    Total Intake(mL/kg) 3038.7 (26.4)    Urine (mL/kg/hr) 1000    Stool 0    Blood 50    Total Output 1050    Net  +1988.7         Urine Occurrence 1 x    Stool Occurrence 0 x       LABORATORY DATA: Recent Labs    02/15/19 0308  WBC 16.3*  HGB 11.5*  HCT 37.7  PLT 403*   Recent Labs    02/15/19 0308  NA 139  K 4.0  CL 109  CO2 22  BUN 17  CREATININE 0.72  GLUCOSE 119*  CALCIUM 8.6*   No results found for: INR, PROTIME  Examination:  General appearance: alert, cooperative and no distress Extremities: extremities normal, atraumatic, no cyanosis or edema  Wound Exam: clean, dry, intact   Drainage:  None: wound tissue dry  Motor Exam: Quadriceps and Hamstrings Intact  Sensory Exam: Superficial Peroneal, Deep Peroneal and Tibial normal   Assessment:    1 Day Post-Op  Procedure(s) (LRB): TOTAL KNEE ARTHROPLASTY (Left)  ADDITIONAL DIAGNOSIS:  Active Problems:   S/P total knee replacement     Plan: Physical Therapy as ordered Weight Bearing as Tolerated (WBAT)  DVT Prophylaxis:  Aspirin  DISCHARGE PLAN: Home      Patient doing well, expect D/C home today  Patient's anticipated LOS is less than 2 midnights, meeting these requirements: - Lives within 1 hour of care - Has a competent adult at home to recover with post-op recover - NO history of  - Chronic pain requiring opiods  - Diabetes  - Coronary Artery Disease  - Heart failure  - Heart attack  - Stroke  - DVT/VTE  - Cardiac arrhythmia  - Respiratory Failure/COPD  - Renal failure  - Anemia  - Advanced Liver disease        Donia Ast 02/15/2019, 7:51 AM

## 2019-02-15 NOTE — Discharge Summary (Signed)
SPORTS MEDICINE & JOINT REPLACEMENT   Lara Mulch, MD   Carlyon Shadow, PA-C [place], [place], [place]  [address]                             [phone]  PATIENT ID: Ariel Fisher        MRN:  [id number]          DOB/AGE: [birth year] / 73 y.o.    DISCHARGE SUMMARY  ADMISSION DATE:    02/14/2019 DISCHARGE DATE:   02/15/2019   ADMISSION DIAGNOSIS: Primary Osteoarthritis Left Knee    DISCHARGE DIAGNOSIS:  Primary Osteoarthritis Left Knee    ADDITIONAL DIAGNOSIS: Active Problems:   S/P total knee replacement  Past Medical History:  Diagnosis Date  . Anxiety   . Depression   . Hypertension     PROCEDURE: Procedure(s): TOTAL KNEE ARTHROPLASTY on 02/14/2019  CONSULTS:    HISTORY:  See H&P in chart  HOSPITAL COURSE:  Ariel Fisher is a 73 y.o. admitted on 02/14/2019 and found to have a diagnosis of Primary Osteoarthritis Left Knee.  After appropriate laboratory studies were obtained  they were taken to the operating room on 02/14/2019 and underwent Procedure(s): TOTAL KNEE ARTHROPLASTY.   They were given perioperative antibiotics:  Anti-infectives (From admission, onward)   Start     Dose/Rate Route Frequency Ordered Stop   02/14/19 1800  ceFAZolin (ANCEF) IVPB 2g/100 mL premix     2 g 200 mL/hr over 30 Minutes Intravenous Every 6 hours 02/14/19 1523 02/15/19 0005   02/14/19 0900  ceFAZolin (ANCEF) IVPB 2g/100 mL premix     2 g 200 mL/hr over 30 Minutes Intravenous On call to O.R. 02/14/19 8101 02/14/19 1219    .  Patient given tranexamic acid IV or topical and exparel intra-operatively.  Tolerated the procedure well.    POD# 1: Vital signs were stable.  Patient denied Chest pain, shortness of breath, or calf pain.  Patient was started on Aspirin twice daily at 8am.  Consults to PT, OT, and care management were made.  The patient was weight bearing as tolerated.  CPM was placed on the operative leg 0-90 degrees for 6-8 hours a day. When out of  the CPM, patient was placed in the foam block to achieve full extension. Incentive spirometry was taught.  Dressing was changed.       POD #2, Continued  PT for ambulation and exercise program.  IV saline locked.  O2 discontinued.    The remainder of the hospital course was dedicated to ambulation and strengthening.   The patient was discharged on 1 Day Post-Op in  Good condition.  Blood products given:none  DIAGNOSTIC STUDIES: Recent vital signs:  Patient Vitals for the past 24 hrs:  BP Temp Temp src Pulse Resp SpO2 Height Weight  02/15/19 0553 115/72 97.8 F (36.6 C) Oral 66 14 99 % - -  02/15/19 0143 119/69 98.1 F (36.7 C) Oral 64 16 96 % - -  02/14/19 2159 115/63 97.7 F (36.5 C) Oral 71 16 98 % - -  02/14/19 1637 (!) 125/91 - - 81 15 100 % - -  02/14/19 1523 117/86 (!) 97.5 F (36.4 C) Oral 68 - 100 % - -  02/14/19 1520 - - - - - - 5\' 7"  (1.702 m) 115.2 kg  02/14/19 1509 117/88 - - 69 14 94 % - -  02/14/19 1500 124/90 - - 69  11 96 % - -  02/14/19 1445 119/87 - - 69 13 97 % - -  02/14/19 1430 123/88 - - 61 11 100 % - -  02/14/19 1415 112/80 - - (!) 55 12 100 % - -  02/14/19 1400 102/68 - - 60 13 99 % - -  02/14/19 1345 100/72 (!) 97.5 F (36.4 C) - 67 14 100 % - -  02/14/19 1147 133/83 - - 65 13 100 % - -  02/14/19 1142 (!) 133/91 - - 68 10 96 % - -  02/14/19 1137 (!) 152/92 - - 61 14 100 % - -  02/14/19 1132 (!) 139/96 - - 76 17 95 % - -  02/14/19 0902 140/87 98.1 F (36.7 C) Oral 84 16 98 % - -       Recent laboratory studies: Recent Labs    02/15/19 0308  WBC 16.3*  HGB 11.5*  HCT 37.7  PLT 403*   Recent Labs    02/15/19 0308  NA 139  K 4.0  CL 109  CO2 22  BUN 17  CREATININE 0.72  GLUCOSE 119*  CALCIUM 8.6*   No results found for: INR, PROTIME   Recent Radiographic Studies :  No results found.  DISCHARGE INSTRUCTIONS: Discharge Instructions    Call MD / Call 911   Complete by: As directed    If you experience chest pain or shortness of  breath, CALL 911 and be transported to the hospital emergency room.  If you develope a fever above 101 F, pus (white drainage) or increased drainage or redness at the wound, or calf pain, call your surgeon's office.   Constipation Prevention   Complete by: As directed    Drink plenty of fluids.  Prune juice may be helpful.  You may use a stool softener, such as Colace (over the counter) 100 mg twice a day.  Use MiraLax (over the counter) for constipation as needed.   Diet - low sodium heart healthy   Complete by: As directed    Discharge instructions   Complete by: As directed    INSTRUCTIONS AFTER JOINT REPLACEMENT   Remove items at home which could result in a fall. This includes throw rugs or furniture in walking pathways ICE to the affected joint every three hours while awake for 30 minutes at a time, for at least the first 3-5 days, and then as needed for pain and swelling.  Continue to use ice for pain and swelling. You may notice swelling that will progress down to the foot and ankle.  This is normal after surgery.  Elevate your leg when you are not up walking on it.   Continue to use the breathing machine you got in the hospital (incentive spirometer) which will help keep your temperature down.  It is common for your temperature to cycle up and down following surgery, especially at night when you are not up moving around and exerting yourself.  The breathing machine keeps your lungs expanded and your temperature down.   DIET:  As you were doing prior to hospitalization, we recommend a well-balanced diet.  DRESSING / WOUND CARE / SHOWERING  Keep the surgical dressing until follow up.  The dressing is water proof, so you can shower without any extra covering.  IF THE DRESSING FALLS OFF or the wound gets wet inside, change the dressing with sterile gauze.  Please use good hand washing techniques before changing the dressing.  Do not use any lotions  or creams on the incision until instructed  by your surgeon.    ACTIVITY  Increase activity slowly as tolerated, but follow the weight bearing instructions below.   No driving for 6 weeks or until further direction given by your physician.  You cannot drive while taking narcotics.  No lifting or carrying greater than 10 lbs. until further directed by your surgeon. Avoid periods of inactivity such as sitting longer than an hour when not asleep. This helps prevent blood clots.  You may return to work once you are authorized by your doctor.     WEIGHT BEARING   Weight bearing as tolerated with assist device (walker, cane, etc) as directed, use it as long as suggested by your surgeon or therapist, typically at least 4-6 weeks.   EXERCISES  Results after joint replacement surgery are often greatly improved when you follow the exercise, range of motion and muscle strengthening exercises prescribed by your doctor. Safety measures are also important to protect the joint from further injury. Any time any of these exercises cause you to have increased pain or swelling, decrease what you are doing until you are comfortable again and then slowly increase them. If you have problems or questions, call your caregiver or physical therapist for advice.   Rehabilitation is important following a joint replacement. After just a few days of immobilization, the muscles of the leg can become weakened and shrink (atrophy).  These exercises are designed to build up the tone and strength of the thigh and leg muscles and to improve motion. Often times heat used for twenty to thirty minutes before working out will loosen up your tissues and help with improving the range of motion but do not use heat for the first two weeks following surgery (sometimes heat can increase post-operative swelling).   These exercises can be done on a training (exercise) mat, on the floor, on a table or on a bed. Use whatever works the best and is most comfortable for you.    Use music  or television while you are exercising so that the exercises are a pleasant break in your day. This will make your life better with the exercises acting as a break in your routine that you can look forward to.   Perform all exercises about fifteen times, three times per day or as directed.  You should exercise both the operative leg and the other leg as well.   Exercises include:   Quad Sets - Tighten up the muscle on the front of the thigh (Quad) and hold for 5-10 seconds.   Straight Leg Raises - With your knee straight (if you were given a brace, keep it on), lift the leg to 60 degrees, hold for 3 seconds, and slowly lower the leg.  Perform this exercise against resistance later as your leg gets stronger.  Leg Slides: Lying on your back, slowly slide your foot toward your buttocks, bending your knee up off the floor (only go as far as is comfortable). Then slowly slide your foot back down until your leg is flat on the floor again.  Angel Wings: Lying on your back spread your legs to the side as far apart as you can without causing discomfort.  Hamstring Strength:  Lying on your back, push your heel against the floor with your leg straight by tightening up the muscles of your buttocks.  Repeat, but this time bend your knee to a comfortable angle, and push your heel against the floor.  You  may put a pillow under the heel to make it more comfortable if necessary.   A rehabilitation program following joint replacement surgery can speed recovery and prevent re-injury in the future due to weakened muscles. Contact your doctor or a physical therapist for more information on knee rehabilitation.    CONSTIPATION  Constipation is defined medically as fewer than three stools per week and severe constipation as less than one stool per week.  Even if you have a regular bowel pattern at home, your normal regimen is likely to be disrupted due to multiple reasons following surgery.  Combination of anesthesia,  postoperative narcotics, change in appetite and fluid intake all can affect your bowels.   YOU MUST use at least one of the following options; they are listed in order of increasing strength to get the job done.  They are all available over the counter, and you may need to use some, POSSIBLY even all of these options:    Drink plenty of fluids (prune juice may be helpful) and high fiber foods Colace 100 mg by mouth twice a day  Senokot for constipation as directed and as needed Dulcolax (bisacodyl), take with full glass of water  Miralax (polyethylene glycol) once or twice a day as needed.  If you have tried all these things and are unable to have a bowel movement in the first 3-4 days after surgery call either your surgeon or your primary doctor.    If you experience loose stools or diarrhea, hold the medications until you stool forms back up.  If your symptoms do not get better within 1 week or if they get worse, check with your doctor.  If you experience "the worst abdominal pain ever" or develop nausea or vomiting, please contact the office immediately for further recommendations for treatment.   ITCHING:  If you experience itching with your medications, try taking only a single pain pill, or even half a pain pill at a time.  You can also use Benadryl over the counter for itching or also to help with sleep.   TED HOSE STOCKINGS:  Use stockings on both legs until for at least 2 weeks or as directed by physician office. They may be removed at night for sleeping.  MEDICATIONS:  See your medication summary on the "After Visit Summary" that nursing will review with you.  You may have some home medications which will be placed on hold until you complete the course of blood thinner medication.  It is important for you to complete the blood thinner medication as prescribed.  PRECAUTIONS:  If you experience chest pain or shortness of breath - call 911 immediately for transfer to the hospital emergency  department.   If you develop a fever greater that 101 F, purulent drainage from wound, increased redness or drainage from wound, foul odor from the wound/dressing, or calf pain - CONTACT YOUR SURGEON.                                                   FOLLOW-UP APPOINTMENTS:  If you do not already have a post-op appointment, please call the office for an appointment to be seen by your surgeon.  Guidelines for how soon to be seen are listed in your "After Visit Summary", but are typically between 1-4 weeks after surgery.  OTHER INSTRUCTIONS:   Knee  Replacement:  Do not place pillow under knee, focus on keeping the knee straight while resting. CPM instructions: 0-90 degrees, 2 hours in the morning, 2 hours in the afternoon, and 2 hours in the evening. Place foam block, curve side up under heel at all times except when in CPM or when walking.  DO NOT modify, tear, cut, or change the foam block in any way.  MAKE SURE YOU:  Understand these instructions.  Get help right away if you are not doing well or get worse.    Thank you for letting us be a part of your medical care team.  It is a privilege we respect greatly.  We hope these instructions will help you stay on track for a fast and full recovery!   Increase activity slowly as tolerated   Complete by: As directed       DISCHARGE MEDICATIONS:   Allergies as of 02/15/2019      Reactions   Tramadol Other (See Comments)   Hives, nausea, dizziness      Medication List    STOP taking these medications   meloxicam 15 MG tablet Commonly known as: MOBIC     TAKE these medications   amLODipine 2.5 MG tablet Commonly known as: NORVASC Take 2.5 mg by mouth at bedtime.   aspirin 325 MG EC tablet Take 1 tablet (325 mg total) by mouth 2 (two) times daily.   DULoxetine 30 MG capsule Commonly known as: Cymbalta Take 1 cap by mouth at night for 7 nights then take 2 capsules daily at night   Fish Oil 500 MG Caps Take 500 mg by mouth  daily.   FLAX SEED OIL PO Take 1 tablet by mouth daily.   gabapentin 600 MG tablet Commonly known as: NEURONTIN Take 600 mg by mouth at bedtime.   methocarbamol 500 MG tablet Commonly known as: ROBAXIN Take 1-2 tablets (500-1,000 mg total) by mouth every 6 (six) hours as needed for muscle spasms.   OVER THE COUNTER MEDICATION Take 1 tablet by mouth daily. Keto supplement   oxyCODONE 5 MG immediate release tablet Commonly known as: Oxy IR/ROXICODONE Take 1-2 tablets (5-10 mg total) by mouth every 6 (six) hours as needed for moderate pain (pain score 4-6).            Durable Medical Equipment  (From admission, onward)         Start     Ordered   02/14/19 1523  DME Walker rolling  Once    Question:  Patient needs a walker to treat with the following condition  Answer:  S/P total knee replacement   02/14/19 1523   02/14/19 1523  DME 3 n 1  Once     02/14/19 1523   02/14/19 1523  DME Bedside commode  Once    Question:  Patient needs a bedside commode to treat with the following condition  Answer:  S/P total knee replacement   02/14/19 1523          FOLLOW UP VISIT:    DISPOSITION: HOME VS. SNF  CONDITION:  Good   Guy Sandifer 02/15/2019, 7:56 AM

## 2019-02-15 NOTE — Progress Notes (Signed)
Physical Therapy Treatment Patient Details Name: Ariel Fisher MRN: [id number] DOB: [birth year] Today's Date: 02/15/2019    History of Present Illness s/p L TKA. Hx: PLIF    PT Comments    Pt progressing well.  Much improved quad control today, no knee buckling with WBing.  Reviewed gait, safety, stairs.  Pt ready for d/c from PT standpoint.  Follow Up Recommendations  Follow surgeon's recommendation for DC plan and follow-up therapies     Equipment Recommendations  None recommended by PT    Recommendations for Other Services       Precautions / Restrictions Precautions Precautions: Fall;Knee Precaution Comments: much improved quad control today, no knee buckling noted Restrictions Weight Bearing Restrictions: No Other Position/Activity Restrictions: WBAT    Mobility  Bed Mobility               General bed mobility comments: pt OOB in recliner on arrival  Transfers Overall transfer level: Needs assistance Equipment used: Rolling walker (2 wheeled) Transfers: Sit to/from Stand Sit to Stand: Min guard;Supervision         General transfer comment: repeated from varied ht surfaces, cues for hand placement   Ambulation/Gait Ambulation/Gait assistance: Supervision Gait Distance (Feet): 100 Feet Assistive device: Rolling walker (2 wheeled) Gait Pattern/deviations: Step-to pattern;Decreased step length - right;Decreased step length - left;Decreased stance time - left;Decreased dorsiflexion - left     General Gait Details: cues for  sequence and RW position   Stairs Stairs: Yes Stairs assistance: Min assist;Min guard Stair Management: Step to pattern;Backwards;With walker Number of Stairs: 3 General stair comments: cues for sequence and safe technique    Wheelchair Mobility    Modified Rankin (Stroke Patients Only)       Balance                                            Cognition Arousal/Alertness: Awake/alert Behavior  During Therapy: WFL for tasks assessed/performed Overall Cognitive Status: Within Functional Limits for tasks assessed                                        Exercises Total Joint Exercises Ankle Circles/Pumps: AROM;Both;10 reps Quad Sets: 10 reps;Both;AROM Short Arc Quad: AROM;Left;10 reps Heel Slides: AROM;AAROM;10 reps;Left Hip ABduction/ADduction: AROM;Left;10 reps Straight Leg Raises: AROM;Left;10 reps Long Arc Quad: AROM;Left;10 reps Goniometric ROM: grossly 7 to 90 degrees flexion left knee AAROM    General Comments        Pertinent Vitals/Pain Pain Assessment: 0-10 Pain Score: 4  Pain Location: L knee Pain Descriptors / Indicators: Grimacing;Sore Pain Intervention(s): Limited activity within patient's tolerance;Monitored during session;Premedicated before session;Repositioned;Ice applied    Home Living                      Prior Function            PT Goals (current goals can now be found in the care plan section) Acute Rehab PT Goals PT Goal Formulation: With patient Time For Goal Achievement: 02/20/19 Potential to Achieve Goals: Good Progress towards PT goals: Progressing toward goals    Frequency    7X/week      PT Plan Current plan remains appropriate    Co-evaluation  AM-PAC PT "6 Clicks" Mobility   Outcome Measure  Help needed turning from your back to your side while in a flat bed without using bedrails?: A Little Help needed moving from lying on your back to sitting on the side of a flat bed without using bedrails?: A Little Help needed moving to and from a bed to a chair (including a wheelchair)?: A Little Help needed standing up from a chair using your arms (e.g., wheelchair or bedside chair)?: A Little Help needed to walk in hospital room?: A Little Help needed climbing 3-5 steps with a railing? : A Little 6 Click Score: 18    End of Session Equipment Utilized During Treatment: Gait  belt Activity Tolerance: Patient tolerated treatment well Patient left: in chair;with call bell/phone within reach   PT Visit Diagnosis: Difficulty in walking, not elsewhere classified (R26.2)     Time: 4287-6811 PT Time Calculation (min) (ACUTE ONLY): 25 min  Charges:  $Gait Training: 23-37 mins                     Kenyon Ana, PT  Pager: [phone] Acute Rehab Dept [phone]): [phone]   02/15/2019    [facility] 02/15/2019, 3:44 PM

## 2019-02-16 ENCOUNTER — Encounter (HOSPITAL_COMMUNITY): Payer: Self-pay | Admitting: Orthopedic Surgery

## 2019-02-21 ENCOUNTER — Other Ambulatory Visit: Payer: Self-pay | Admitting: Orthopedic Surgery

## 2019-03-09 ENCOUNTER — Encounter: Payer: Self-pay | Admitting: Physical Medicine and Rehabilitation

## 2019-03-09 NOTE — Progress Notes (Signed)
Ariel Fisher Ariel Fisher - 73 y.o. female MRN [id number]  Date of birth: 1945/06/11  Office Visit Note: Visit Date: 02/03/2019 PCP: Andi DevonShelton, Kimberly, MD Referred by: Andi DevonShelton, Kimberly, MD  Subjective: Chief Complaint  Patient presents with  . Lower Back - Pain   HPI: Ariel Fisher is a 73 y.o. female who comes in today Reevaluation management of low back pain and some pain in the hip and thighs.  She is status post L4-5 lumbar fusion by Dr. Lovell SheehanJenkins.  She really has not gotten long-term relief with epidural injection more recently.  MRI from 2017 shows facet arthropathy with foraminal narrowing no high-grade stenosis.  She also is battling a bad knee.  She sees Dr. Georgena SpurlingStephen Lucey and is undergoing total knee replacement soon.  The last time we talked we started duloxetine she was unable to tolerate this due to nausea and blurred vision and agitation and diarrhea.  She does not carry a diagnosis of fibromyalgia.  She has significant intolerances to most medications.  We have tried gabapentin and Lyrica and she cannot tolerate those.  She seems to tolerate oxycodone fine without many side effects.  She has had no new symptoms since have seen her.  No focal weakness no trauma.  Case is complicated by knee arthritis as well as morbid obesity.  Review of Systems  Constitutional: Negative for chills, fever, malaise/fatigue and weight loss.  HENT: Negative for hearing loss and sinus pain.   Eyes: Negative for blurred vision, double vision and photophobia.  Respiratory: Negative for cough and shortness of breath.   Cardiovascular: Negative for chest pain, palpitations and leg swelling.  Gastrointestinal: Negative for abdominal pain, nausea and vomiting.  Genitourinary: Negative for flank pain.  Musculoskeletal: Positive for back pain and joint pain. Negative for myalgias.  Skin: Negative for itching and rash.  Neurological: Negative for tremors, focal weakness and weakness.  Endo/Heme/Allergies:  Negative.   Psychiatric/Behavioral: Negative for depression.  All other systems reviewed and are negative.  Otherwise per HPI.  Assessment & Plan: Visit Diagnoses:  1. Post laminectomy syndrome   2. Chronic pain syndrome   3. Chronic bilateral low back pain without sciatica   4. Spondylosis without myelopathy or radiculopathy, lumbar region     Plan: Findings:  At this point not sure exactly what he had with this patient.  She has continued low back pain despite lumbar fusion not responding to any length of time with injections.  Is not tolerant of most medications.  Neck step is likely MRI repeat if she continues to have worsening symptoms.  She may benefit from chronic pain management and this can be set up through her primary care physician.  Places to look at would be [facility] or through [facility].  We have also been seeing good results from the local [place] [facility].  She is going to have her knee replaced and I think if she gets through that and we could see how she is doing after her knee feels better may be there is something we can look at in terms of the facet joints below the fusion.  Or obviously if an MRI shows anything.  She could be a spinal cord stimulator candidate but I am not sure she will even want to look at that considering its more of a device.    Meds & Orders: No orders of the defined types were placed in this encounter.  No orders of the defined types  were placed in this encounter.   Follow-up: Return if symptoms worsen or fail to improve.   Procedures: No procedures performed  No notes on file   Clinical History: Lumbar Spine MRI 01/17/2016 FINDINGS: #Osseous structures: Prior posterior decompression with hardware fixation and interbody spacer at L4-L5. The vertebral body heights and marrow signal are unremarkable. No evidence of an acute fracture. Thoracolumbar anterolateral osteophytes are  appreciated.  #Alignment:Alignment maintained. #Conus medullaris/cauda equina: Spinal cord signal and termination are within normal limits.  #Lower thoracic spine: No significant spinal canal or foraminal stenosis. This is viewed on the sagittal images only.  #T12-L1: No significant disc herniation, spinal canal or neuroforaminal compromise. This is viewed on sagittal images only. #L1-L2: No significant disc herniation, spinal canal or neuroforaminal compromise. This is viewed on the sagittal images only. #L2-L3: No significant disc herniation, spinal canal or neural foraminal compromise. #L3-L4: Mild facet hypertrophy on the LEFT. No significant disc herniation, spinal canal or neural foraminal compromise. #L4-L5: Postoperative changes. Spinal canal and foramen are patent. #L5-S1: Facet hypertrophy on the RIGHT. No significant disc herniation. Mild RIGHT neuroforaminal stenosis. The LEFT neuroforamen and spinal canal are patent.  #Paraspinal tissues: Postoperative changes at L4-L5.  Cervcial MRI 01/2016 IMPRESSION: 1.Moderate cervical spondylosis most prominently at C4-C5 and C5-C6 with degenerative facet changes throughout the cervical levels. 2.Multilevel disc bulges, disc osteophytes and small protrusions mildly effaces the anterior thecal sac and mildly to moderately narrow the neural foramina most prominently on the right at C4-C5. Possible mild impingement on the exiting right C5 root.   She reports that she has never smoked. She has never used smokeless tobacco. No results for input(s): HGBA1C, LABURIC in the last 8760 hours.  Objective:  VS:  HT:    WT:   BMI:     BP:140/83  HR:69bpm  TEMP: ( )  RESP:  Physical Exam Vitals signs and nursing note reviewed.  Constitutional:      General: She is not in acute distress.    Appearance: Normal appearance. She is well-developed. She is obese. She is not ill-appearing.  HENT:     Head: Normocephalic and atraumatic.   Eyes:     Conjunctiva/sclera: Conjunctivae normal.     Pupils: Pupils are equal, round, and reactive to light.  Cardiovascular:     Rate and Rhythm: Normal rate.     Pulses: Normal pulses.  Pulmonary:     Effort: Pulmonary effort is normal.  Musculoskeletal:     Right lower leg: No edema.     Left lower leg: No edema.     Comments: Patient is slow to rise from a seated position to full extension.  She has pain with extension rotation.  She has tenderness across the lower back and PSIS.  No pain with greater trochanters no pain with hip rotation good distal strength  Skin:    General: Skin is warm and dry.     Findings: No erythema or rash.  Neurological:     General: No focal deficit present.     Mental Status: She is alert and oriented to person, place, and time.     Sensory: No sensory deficit.     Motor: No abnormal muscle tone.     Coordination: Coordination normal.     Gait: Gait normal.  Psychiatric:        Mood and Affect: Mood normal.        Behavior: Behavior normal.     Ortho Exam Imaging: No results found.  Past Medical/Family/Surgical/Social History: Medications & Allergies reviewed per EMR, new medications updated. Patient Active Problem List   Diagnosis Date Noted  . 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 Spondylolisthesis of lumbar region 08/08/2013   Past Medical History:  Diagnosis Date  . Anxiety   . Depression   . Hypertension    History reviewed. No pertinent family history. Past Surgical History:  Procedure Laterality Date  . CARDIAC CATHETERIZATION     15 yrs ago ..clean  . CESAREAN SECTION    . TOTAL KNEE ARTHROPLASTY Left 02/14/2019   Procedure: TOTAL KNEE ARTHROPLASTY;  Surgeon: Vickey Huger, MD;  Location: [facility];  Service: Orthopedics;  Laterality: Left;  75 mins needed for length of case   Social History   Occupational History  . Not on file  Tobacco Use  . Smoking status: Never Smoker  . Smokeless tobacco: Never Used   Substance and Sexual Activity  . Alcohol use: Yes    Comment: occ  . Drug use: No  . Sexual activity: Not on file

## 2019-04-04 NOTE — Patient Instructions (Addendum)
DUE TO COVID-19 ONLY ONE VISITOR IS ALLOWED TO COME WITH YOU AND STAY IN THE WAITING ROOM ONLY DURING PRE OP AND PROCEDURE DAY OF SURGERY. THE 1 VISITOR MAY VISIT WITH YOU AFTER SURGERY IN YOUR PRIVATE ROOM DURING VISITING HOURS ONLY!  YOU NEED TO HAVE A COVID 19 TEST ON 04-07-19  @ 10:35 AM, THIS TEST MUST BE DONE BEFORE SURGERY, COME  [address], [place] , [address].  [facility]) ONCE YOUR COVID TEST IS COMPLETED, PLEASE BEGIN THE QUARANTINE INSTRUCTIONS AS OUTLINED IN YOUR HANDOUT.                Ariel Fisher  04/04/2019   Your procedure is scheduled on: 04-11-19    Report to [facility] Main  Entrance    Report to Admitting at 5:30 AM     Call this number if you have problems the morning of surgery [phone]     Remember: NO SOLID FOOD AFTER MIDNIGHT THE NIGHT PRIOR TO SURGERY. NOTHING BY MOUTH EXCEPT CLEAR LIQUIDS UNTIL 4:15 AM . PLEASE FINISH ENSURE DRINK PER SURGEON ORDER  WHICH NEEDS TO BE COMPLETED AT 4:15 AM .   CLEAR LIQUID DIET   Foods Allowed                                                                     Foods Excluded  Coffee and tea, regular and decaf                             liquids that you cannot  Plain Jell-O any favor except red or purple                                           see through such as: Fruit ices (not with fruit pulp)                                     milk, soups, orange juice  Iced Popsicles                                    All solid food Carbonated beverages, regular and diet                                    Cranberry, grape and apple juices Sports drinks like Gatorade Lightly seasoned clear broth or consume(fat free) Sugar, honey syrup  Sample Menu Breakfast                                Lunch                                     Supper Cranberry juice  Beef broth                            Chicken broth Jell-O                                     Grape juice                            Apple juice Coffee or tea                        Jell-O                                      Popsicle                                                Coffee or tea                        Coffee or tea  _____________________________________________________________________      Take these medicines the morning of surgery with A SIP OF WATER: None   BRUSH YOUR TEETH MORNING OF SURGERY AND RINSE YOUR MOUTH OUT, NO CHEWING GUM CANDY OR MINTS.                               You may not have any metal on your body including hair pins and              piercings     Do not wear jewelry, make-up, lotions, powders or perfumes, deodorant              Do not wear nail polish on your fingernails.  Do not shave  48 hours prior to surgery.             Do not bring valuables to the hospital. [facility] IS NOT             RESPONSIBLE   FOR VALUABLES.  Contacts, dentures or bridgework may not be worn into surgery.  You may bring your overnight bag     Special Instructions: N/A              Please read over the following fact sheets you were given: _____________________________________________________________________             [facility] - Preparing for [facility] Before surgery, you can play an important role.  Because skin is not sterile, your skin needs to be as free of germs as possible.  You can reduce the number of germs on your skin by washing with CHG (chlorahexidine gluconate) soap before surgery.  CHG is an antiseptic cleaner which kills germs and bonds with the skin to continue killing germs even after washing. Please DO NOT use if you have an allergy to CHG or antibacterial soaps.  If your skin becomes reddened/irritated stop using the CHG and inform your nurse when you arrive at Short Stay. Do not shave (including legs and underarms) for at least 48 hours  prior to the first CHG shower.  You may shave your face/neck. Please follow these instructions carefully:  1.   Shower with CHG Soap the night before surgery and the  morning of Surgery.  2.  If you choose to wash your hair, wash your hair first as usual with your  normal  shampoo.  3.  After you shampoo, rinse your hair and body thoroughly to remove the  shampoo.                           4.  Use CHG as you would any other liquid soap.  You can apply chg directly  to the skin and wash                       Gently with a scrungie or clean washcloth.  5.  Apply the CHG Soap to your body ONLY FROM THE NECK DOWN.   Do not use on face/ open                           Wound or open sores. Avoid contact with eyes, ears mouth and genitals (private parts).                       Wash face,  Genitals (private parts) with your normal soap.             6.  Wash thoroughly, paying special attention to the area where your surgery  will be performed.  7.  Thoroughly rinse your body with warm water from the neck down.  8.  DO NOT shower/wash with your normal soap after using and rinsing off  the CHG Soap.                9.  Pat yourself dry with a clean towel.            10.  Wear clean pajamas.            11.  Place clean sheets on your bed the night of your first shower and do not  sleep with pets. Day of Surgery : Do not apply any lotions/deodorants the morning of surgery.  Please wear clean clothes to the hospital/surgery center.  FAILURE TO FOLLOW THESE INSTRUCTIONS MAY RESULT IN THE CANCELLATION OF YOUR SURGERY PATIENT SIGNATURE_________________________________  NURSE SIGNATURE__________________________________  ________________________________________________________________________   Ariel Fisher  An incentive spirometer is a tool that can help keep your lungs clear and active. This tool measures how well you are filling your lungs with each breath. Taking long deep breaths may help reverse or decrease the chance of developing breathing (pulmonary) problems (especially infection) following:  A long  period of time when you are unable to move or be active. BEFORE THE PROCEDURE   If the spirometer includes an indicator to show your best effort, your nurse or respiratory therapist will set it to a desired goal.  If possible, sit up straight or lean slightly forward. Try not to slouch.  Hold the incentive spirometer in an upright position. INSTRUCTIONS FOR USE  1. Sit on the edge of your bed if possible, or sit up as far as you can in bed or on a chair. 2. Hold the incentive spirometer in an upright position. 3. Breathe out normally. 4. Place the mouthpiece in your mouth and seal your lips tightly around it. 5. Breathe  in slowly and as deeply as possible, raising the piston or the ball toward the top of the column. 6. Hold your breath for 3-5 seconds or for as long as possible. Allow the piston or ball to fall to the bottom of the column. 7. Remove the mouthpiece from your mouth and breathe out normally. 8. Rest for a few seconds and repeat Steps 1 through 7 at least 10 times every 1-2 hours when you are awake. Take your time and take a few normal breaths between deep breaths. 9. The spirometer may include an indicator to show your best effort. Use the indicator as a goal to work toward during each repetition. 10. After each set of 10 deep breaths, practice coughing to be sure your lungs are clear. If you have an incision (the cut made at the time of surgery), support your incision when coughing by placing a pillow or rolled up towels firmly against it. Once you are able to get out of bed, walk around indoors and cough well. You may stop using the incentive spirometer when instructed by your caregiver.  RISKS AND COMPLICATIONS  Take your time so you do not get dizzy or light-headed.  If you are in pain, you may need to take or ask for pain medication before doing incentive spirometry. It is harder to take a deep breath if you are having pain. AFTER USE  Rest and breathe slowly and  easily.  It can be helpful to keep track of a log of your progress. Your caregiver can provide you with a simple table to help with this. If you are using the spirometer at home, follow these instructions: [place] IF:   You are having difficultly using the spirometer.  You have trouble using the spirometer as often as instructed.  Your pain medication is not giving enough relief while using the spirometer.  You develop fever of 100.5 F (38.1 C) or higher. SEEK IMMEDIATE MEDICAL CARE IF:   You cough up bloody sputum that had not been present before.  You develop fever of 102 F (38.9 C) or greater.  You develop worsening pain at or near the incision site. MAKE SURE YOU:   Understand these instructions.  Will watch your condition.  Will get help right away if you are not doing well or get worse. Document Released: 08/18/2006 Document Revised: 06/30/2011 Document Reviewed: 10/19/2006 [facility] Patient Information 2014 [place], [place].   ________________________________________________________________________

## 2019-04-06 ENCOUNTER — Encounter (HOSPITAL_COMMUNITY)
Admission: RE | Admit: 2019-04-06 | Discharge: 2019-04-06 | Disposition: A | Discharge status: Home / Self Care | Payer: Medicare HMO | Source: Ambulatory Visit | Attending: Orthopedic Surgery | Admitting: Orthopedic Surgery

## 2019-04-06 ENCOUNTER — Encounter (HOSPITAL_COMMUNITY): Payer: Self-pay

## 2019-04-06 ENCOUNTER — Other Ambulatory Visit: Payer: Self-pay

## 2019-04-06 DIAGNOSIS — Z01812 Encounter for preprocedural laboratory examination: Secondary | ICD-10-CM | POA: Diagnosis not present

## 2019-04-06 DIAGNOSIS — M1711 Unilateral primary osteoarthritis, right knee: Secondary | ICD-10-CM | POA: Insufficient documentation

## 2019-04-06 LAB — SURGICAL PCR SCREEN
MRSA, PCR: NEGATIVE
Staphylococcus aureus: NEGATIVE

## 2019-04-06 LAB — CBC WITH DIFFERENTIAL/PLATELET
Abs Immature Granulocytes: 0.02 10*3/uL (ref 0.00–0.07)
Basophils Absolute: 0.1 10*3/uL (ref 0.0–0.1)
Basophils Relative: 1 %
Eosinophils Absolute: 0.4 10*3/uL (ref 0.0–0.5)
Eosinophils Relative: 5 %
HCT: 41.1 % (ref 36.0–46.0)
Hemoglobin: 12.8 g/dL (ref 12.0–15.0)
Immature Granulocytes: 0 %
Lymphocytes Relative: 22 %
Lymphs Abs: 1.6 10*3/uL (ref 0.7–4.0)
MCH: 30.4 pg (ref 26.0–34.0)
MCHC: 31.1 g/dL (ref 30.0–36.0)
MCV: 97.6 fL (ref 80.0–100.0)
Monocytes Absolute: 0.6 10*3/uL (ref 0.1–1.0)
Monocytes Relative: 8 %
Neutro Abs: 4.8 10*3/uL (ref 1.7–7.7)
Neutrophils Relative %: 64 %
Platelets: 454 10*3/uL — ABNORMAL HIGH (ref 150–400)
RBC: 4.21 MIL/uL (ref 3.87–5.11)
RDW: 13.5 % (ref 11.5–15.5)
WBC: 7.4 10*3/uL (ref 4.0–10.5)
nRBC: 0 % (ref 0.0–0.2)

## 2019-04-06 LAB — COMPREHENSIVE METABOLIC PANEL
ALT: 17 U/L (ref 0–44)
AST: 22 U/L (ref 15–41)
Albumin: 4 g/dL (ref 3.5–5.0)
Alkaline Phosphatase: 58 U/L (ref 38–126)
Anion gap: 9 (ref 5–15)
BUN: 12 mg/dL (ref 8–23)
CO2: 25 mmol/L (ref 22–32)
Calcium: 9.2 mg/dL (ref 8.9–10.3)
Chloride: 108 mmol/L (ref 98–111)
Creatinine, Ser: 0.64 mg/dL (ref 0.44–1.00)
GFR calc Af Amer: >60 mL/min (ref >60)
GFR calc non Af Amer: >60 mL/min (ref >60)
Glucose, Bld: 100 mg/dL — ABNORMAL HIGH (ref 70–99)
Potassium: 3.7 mmol/L (ref 3.5–5.1)
Sodium: 142 mmol/L (ref 135–145)
Total Bilirubin: 0.6 mg/dL (ref 0.3–1.2)
Total Protein: 7.1 g/dL (ref 6.5–8.1)

## 2019-04-06 NOTE — Progress Notes (Signed)
PCP - Willey Blade Cardiologist -   Chest x-ray -   EKG - 04-11-19  Stress Test -  ECHO -  Cardiac Cath -   Sleep Study -  CPAP -   Fasting Blood Sugar -  Checks Blood Sugar _____ times a day  Blood Thinner Instructions: Aspirin Instructions: Last Dose:  Anesthesia review:   Patient denies shortness of breath, fever, cough and chest pain at PAT appointment   Patient verbalized understanding of instructions that were given to them at the PAT appointment. Patient was also instructed that they will need to review over the PAT instructions again at home before surgery.

## 2019-04-07 ENCOUNTER — Encounter (HOSPITAL_COMMUNITY): Admission: RE | Admit: 2019-04-07 | Payer: Medicare HMO | Source: Ambulatory Visit

## 2019-04-07 ENCOUNTER — Encounter (HOSPITAL_COMMUNITY): Payer: Self-pay

## 2019-04-07 ENCOUNTER — Other Ambulatory Visit (HOSPITAL_COMMUNITY)
Admission: RE | Admit: 2019-04-07 | Discharge: 2019-04-07 | Disposition: A | Discharge status: Home / Self Care | Payer: Medicare HMO | Source: Ambulatory Visit | Attending: Orthopedic Surgery | Admitting: Orthopedic Surgery

## 2019-04-07 DIAGNOSIS — Z20828 Contact with and (suspected) exposure to other viral communicable diseases: Secondary | ICD-10-CM | POA: Diagnosis not present

## 2019-04-07 DIAGNOSIS — Z01812 Encounter for preprocedural laboratory examination: Secondary | ICD-10-CM | POA: Insufficient documentation

## 2019-04-08 LAB — NOVEL CORONAVIRUS, NAA (HOSP ORDER, SEND-OUT TO REF LAB; TAT 18-24 HRS): SARS-CoV-2, NAA: NOT DETECTED

## 2019-04-10 MED ORDER — BUPIVACAINE LIPOSOME 1.3 % IJ SUSP
20.0000 mL | Freq: Once | INTRAMUSCULAR | Status: DC
  Filled 2019-04-10: qty 20

## 2019-04-10 NOTE — Anesthesia Preprocedure Evaluation (Addendum)
Anesthesia Evaluation  Patient identified by MRN, date of birth, ID band Patient awake    Reviewed: Allergy & Precautions, NPO status , Patient's Chart, lab work & pertinent test results  History of Anesthesia Complications Negative for: history of anesthetic complications  Airway Mallampati: II  TM Distance: >3 FB Neck ROM: Full    Dental  (+) Teeth Intact   Pulmonary neg pulmonary ROS,    Pulmonary exam normal        Cardiovascular hypertension, Pt. on medications Normal cardiovascular exam     Neuro/Psych PSYCHIATRIC DISORDERS Anxiety Depression negative neurological ROS     GI/Hepatic negative GI ROS, Neg liver ROS,   Endo/Other  negative endocrine ROS  Renal/GU negative Renal ROS  negative genitourinary   Musculoskeletal negative musculoskeletal ROS (+)   Abdominal   Peds  Hematology negative hematology ROS (+)   Anesthesia Other Findings plts 454  Had left knee replaced 02/14/19 under spinal, no issues  Reproductive/Obstetrics                            Anesthesia Physical Anesthesia Plan  ASA: II  Anesthesia Plan: Spinal   Post-op Pain Management:  Regional for Post-op pain   Induction:   PONV Risk Score and Plan: 2 and Propofol infusion, Treatment may vary due to age or medical condition, Ondansetron and TIVA  Airway Management Planned: Nasal Cannula and Simple Face Mask  Additional Equipment: None  Intra-op Plan:   Post-operative Plan:   Informed Consent: I have reviewed the patients History and Physical, chart, labs and discussed the procedure including the risks, benefits and alternatives for the proposed anesthesia with the patient or authorized representative who has indicated his/her understanding and acceptance.       Plan Discussed with:   Anesthesia Plan Comments: (Mepivacaine spinal for possible same-day discharge)      Anesthesia Quick  Evaluation                                  Anesthesia Evaluation  Patient identified by MRN, date of birth, ID band Patient awake    Reviewed: Allergy & Precautions, H&P , NPO status , Patient's Chart, lab work & pertinent test results  Airway Mallampati: I TM Distance: >3 FB Neck ROM: Full    Dental  (+) Teeth Intact, Dental Advisory Given   Pulmonary neg pulmonary ROS,  breath sounds clear to auscultation        Cardiovascular hypertension, Rhythm:Regular Rate:Normal     Neuro/Psych Anxiety Depression    GI/Hepatic   Endo/Other    Renal/GU      Musculoskeletal  (+) Arthritis -,   Abdominal (+) + obese,   Peds  Hematology   Anesthesia Other Findings   Reproductive/Obstetrics                          Anesthesia Physical Anesthesia Plan  ASA: III  Anesthesia Plan: General   Post-op Pain Management:    Induction: Intravenous  Airway Management Planned: Oral ETT  Additional Equipment:   Intra-op Plan:   Post-operative Plan: Extubation in OR  Informed Consent: I have reviewed the patients History and Physical, chart, labs and discussed the procedure including the risks, benefits and alternatives for the proposed anesthesia with the patient or authorized representative who has indicated his/her understanding and acceptance.   Dental advisory  given  Plan Discussed with: CRNA and Surgeon  Anesthesia Plan Comments:         Anesthesia Quick Evaluation

## 2019-04-11 ENCOUNTER — Ambulatory Visit (HOSPITAL_COMMUNITY): Payer: Medicare HMO | Admitting: Anesthesiology

## 2019-04-11 ENCOUNTER — Encounter (HOSPITAL_COMMUNITY): Payer: Self-pay | Admitting: Orthopedic Surgery

## 2019-04-11 ENCOUNTER — Encounter (HOSPITAL_COMMUNITY)
Admission: RE | Disposition: A | Discharge status: Home / Self Care | Payer: Self-pay | Source: Home / Self Care | Attending: Orthopedic Surgery

## 2019-04-11 ENCOUNTER — Ambulatory Visit (HOSPITAL_COMMUNITY)
Admission: RE | Admit: 2019-04-11 | Discharge: 2019-04-11 | Disposition: A | Discharge status: Home / Self Care | Payer: Medicare HMO | Attending: Orthopedic Surgery | Admitting: Orthopedic Surgery

## 2019-04-11 ENCOUNTER — Ambulatory Visit (HOSPITAL_COMMUNITY): Payer: Medicare HMO | Admitting: Physician Assistant

## 2019-04-11 DIAGNOSIS — F329 Major depressive disorder, single episode, unspecified: Secondary | ICD-10-CM | POA: Diagnosis not present

## 2019-04-11 DIAGNOSIS — Z791 Long term (current) use of non-steroidal anti-inflammatories (NSAID): Secondary | ICD-10-CM | POA: Diagnosis not present

## 2019-04-11 DIAGNOSIS — I441 Atrioventricular block, second degree: Secondary | ICD-10-CM | POA: Diagnosis not present

## 2019-04-11 DIAGNOSIS — M1711 Unilateral primary osteoarthritis, right knee: Secondary | ICD-10-CM | POA: Diagnosis present

## 2019-04-11 DIAGNOSIS — Z6838 Body mass index (BMI) 38.0-38.9, adult: Secondary | ICD-10-CM | POA: Diagnosis not present

## 2019-04-11 DIAGNOSIS — M6281 Muscle weakness (generalized): Secondary | ICD-10-CM | POA: Diagnosis not present

## 2019-04-11 DIAGNOSIS — M25561 Pain in right knee: Secondary | ICD-10-CM | POA: Diagnosis not present

## 2019-04-11 DIAGNOSIS — I10 Essential (primary) hypertension: Secondary | ICD-10-CM | POA: Diagnosis not present

## 2019-04-11 DIAGNOSIS — Z79899 Other long term (current) drug therapy: Secondary | ICD-10-CM | POA: Diagnosis not present

## 2019-04-11 DIAGNOSIS — F419 Anxiety disorder, unspecified: Secondary | ICD-10-CM | POA: Diagnosis not present

## 2019-04-11 DIAGNOSIS — E669 Obesity, unspecified: Secondary | ICD-10-CM | POA: Insufficient documentation

## 2019-04-11 DIAGNOSIS — Z96652 Presence of left artificial knee joint: Secondary | ICD-10-CM | POA: Insufficient documentation

## 2019-04-11 HISTORY — PX: TOTAL KNEE ARTHROPLASTY: SHX125

## 2019-04-11 SURGERY — ARTHROPLASTY, KNEE, TOTAL
Anesthesia: Spinal | Site: Knee | Laterality: Right

## 2019-04-11 MED ORDER — METHOCARBAMOL 500 MG PO TABS: 500.0000 mg | ORAL_TABLET | Freq: Four times a day (QID) | ORAL | 0 refills | Status: DC | PRN

## 2019-04-11 MED ORDER — TRANEXAMIC ACID-NACL 1000-0.7 MG/100ML-% IV SOLN
1000.0000 mg | INTRAVENOUS | Status: AC
  Administered 2019-04-11: 1000 mg via INTRAVENOUS
  Filled 2019-04-11: qty 100

## 2019-04-11 MED ORDER — LACTATED RINGERS IV BOLUS
500.0000 mL | Freq: Once | INTRAVENOUS | Status: AC
  Administered 2019-04-11: 500 mL via INTRAVENOUS

## 2019-04-11 MED ORDER — BUPIVACAINE LIPOSOME 1.3 % IJ SUSP
INTRAMUSCULAR | Status: DC | PRN
  Administered 2019-04-11: 20 mL

## 2019-04-11 MED ORDER — OXYCODONE HCL 5 MG PO TABS
5.0000 mg | ORAL_TABLET | Freq: Once | ORAL | Status: AC | PRN
  Administered 2019-04-11: 12:00:00 5 mg via ORAL

## 2019-04-11 MED ORDER — METHOCARBAMOL 500 MG PO TABS
ORAL_TABLET | ORAL | Status: AC
  Filled 2019-04-11: qty 1

## 2019-04-11 MED ORDER — MIDAZOLAM HCL 2 MG/2ML IJ SOLN
INTRAMUSCULAR | Status: AC
  Filled 2019-04-11: qty 2

## 2019-04-11 MED ORDER — SODIUM CHLORIDE 0.9% FLUSH
INTRAVENOUS | Status: DC | PRN
  Administered 2019-04-11: 20 mL

## 2019-04-11 MED ORDER — CHLORHEXIDINE GLUCONATE 4 % EX LIQD: 60.0000 mL | Freq: Once | CUTANEOUS | Status: DC

## 2019-04-11 MED ORDER — SODIUM CHLORIDE 0.9 % IR SOLN
Status: DC | PRN
  Administered 2019-04-11: 1000 mL

## 2019-04-11 MED ORDER — ACETAMINOPHEN 500 MG PO TABS
1000.0000 mg | ORAL_TABLET | Freq: Once | ORAL | Status: AC
  Administered 2019-04-11: 06:00:00 1000 mg via ORAL
  Filled 2019-04-11: qty 2

## 2019-04-11 MED ORDER — FENTANYL CITRATE (PF) 100 MCG/2ML IJ SOLN
INTRAMUSCULAR | Status: AC
  Filled 2019-04-11: qty 2

## 2019-04-11 MED ORDER — METHOCARBAMOL 500 MG IVPB - SIMPLE MED: 500.0000 mg | Freq: Four times a day (QID) | INTRAVENOUS | Status: DC | PRN

## 2019-04-11 MED ORDER — ONDANSETRON HCL 4 MG/2ML IJ SOLN
INTRAMUSCULAR | Status: AC
  Filled 2019-04-11: qty 2

## 2019-04-11 MED ORDER — BUPIVACAINE HCL (PF) 0.25 % IJ SOLN
INTRAMUSCULAR | Status: DC | PRN
  Administered 2019-04-11: 30 mL

## 2019-04-11 MED ORDER — OXYCODONE HCL 5 MG PO TABS
ORAL_TABLET | ORAL | Status: AC
  Filled 2019-04-11: qty 1

## 2019-04-11 MED ORDER — MIDAZOLAM HCL 5 MG/5ML IJ SOLN
INTRAMUSCULAR | Status: DC | PRN
  Administered 2019-04-11: .5 mg via INTRAVENOUS
  Administered 2019-04-11: 1.5 mg via INTRAVENOUS

## 2019-04-11 MED ORDER — LACTATED RINGERS IV BOLUS
250.0000 mL | Freq: Once | INTRAVENOUS | Status: AC
  Administered 2019-04-11: 250 mL via INTRAVENOUS

## 2019-04-11 MED ORDER — OXYCODONE HCL 5 MG/5ML PO SOLN: 5.0000 mg | Freq: Once | ORAL | Status: AC | PRN

## 2019-04-11 MED ORDER — METHOCARBAMOL 500 MG PO TABS
500.0000 mg | ORAL_TABLET | Freq: Four times a day (QID) | ORAL | Status: DC | PRN
  Administered 2019-04-11: 500 mg via ORAL

## 2019-04-11 MED ORDER — ASPIRIN EC 325 MG PO TBEC: 325.0000 mg | DELAYED_RELEASE_TABLET | Freq: Two times a day (BID) | ORAL | 0 refills | Status: AC

## 2019-04-11 MED ORDER — CEFAZOLIN SODIUM-DEXTROSE 2-4 GM/100ML-% IV SOLN
2.0000 g | INTRAVENOUS | Status: AC
  Administered 2019-04-11: 2 g via INTRAVENOUS
  Filled 2019-04-11: qty 100

## 2019-04-11 MED ORDER — HYDROCODONE-ACETAMINOPHEN 5-325 MG PO TABS: 1.0000 | ORAL_TABLET | Freq: Four times a day (QID) | ORAL | 0 refills | Status: DC | PRN

## 2019-04-11 MED ORDER — DEXAMETHASONE SODIUM PHOSPHATE 10 MG/ML IJ SOLN
INTRAMUSCULAR | Status: AC
  Filled 2019-04-11: qty 1

## 2019-04-11 MED ORDER — ROPIVACAINE HCL 5 MG/ML IJ SOLN
INTRAMUSCULAR | Status: DC | PRN
  Administered 2019-04-11: 30 mL via PERINEURAL

## 2019-04-11 MED ORDER — BUPIVACAINE HCL 0.25 % IJ SOLN
INTRAMUSCULAR | Status: AC
  Filled 2019-04-11: qty 1

## 2019-04-11 MED ORDER — CELECOXIB 200 MG PO CAPS
400.0000 mg | ORAL_CAPSULE | Freq: Once | ORAL | Status: AC
  Administered 2019-04-11: 400 mg via ORAL
  Filled 2019-04-11: qty 2

## 2019-04-11 MED ORDER — GABAPENTIN 300 MG PO CAPS
300.0000 mg | ORAL_CAPSULE | Freq: Once | ORAL | Status: AC
  Administered 2019-04-11: 300 mg via ORAL
  Filled 2019-04-11: qty 1

## 2019-04-11 MED ORDER — POVIDONE-IODINE 10 % EX SWAB
2.0000 "application " | Freq: Once | CUTANEOUS | Status: AC
  Administered 2019-04-11: 2 via TOPICAL

## 2019-04-11 MED ORDER — ONDANSETRON HCL 4 MG/2ML IJ SOLN: 4.0000 mg | Freq: Once | INTRAMUSCULAR | Status: DC | PRN

## 2019-04-11 MED ORDER — MEPIVACAINE HCL (PF) 2 % IJ SOLN
INTRAMUSCULAR | Status: AC
  Filled 2019-04-11: qty 20

## 2019-04-11 MED ORDER — LIDOCAINE HCL (CARDIAC) PF 100 MG/5ML IV SOSY
PREFILLED_SYRINGE | INTRAVENOUS | Status: DC | PRN
  Administered 2019-04-11: 40 mg via INTRAVENOUS

## 2019-04-11 MED ORDER — SODIUM CHLORIDE (PF) 0.9 % IJ SOLN
INTRAMUSCULAR | Status: AC
  Filled 2019-04-11: qty 20

## 2019-04-11 MED ORDER — FENTANYL CITRATE (PF) 100 MCG/2ML IJ SOLN: 25.0000 ug | INTRAMUSCULAR | Status: DC | PRN

## 2019-04-11 MED ORDER — FENTANYL CITRATE (PF) 100 MCG/2ML IJ SOLN
INTRAMUSCULAR | Status: DC | PRN
  Administered 2019-04-11: 25 ug via INTRAVENOUS
  Administered 2019-04-11: 75 ug via INTRAVENOUS

## 2019-04-11 MED ORDER — LACTATED RINGERS IV SOLN: INTRAVENOUS | Status: DC

## 2019-04-11 MED ORDER — PROPOFOL 500 MG/50ML IV EMUL
INTRAVENOUS | Status: DC | PRN
  Administered 2019-04-11: 25 ug/kg/min via INTRAVENOUS

## 2019-04-11 MED ORDER — PROPOFOL 500 MG/50ML IV EMUL
INTRAVENOUS | Status: AC
  Filled 2019-04-11: qty 50

## 2019-04-11 MED ORDER — ONDANSETRON HCL 4 MG/2ML IJ SOLN
INTRAMUSCULAR | Status: DC | PRN
  Administered 2019-04-11: 4 mg via INTRAVENOUS

## 2019-04-11 MED ORDER — DEXAMETHASONE SODIUM PHOSPHATE 10 MG/ML IJ SOLN
8.0000 mg | Freq: Once | INTRAMUSCULAR | Status: AC
  Administered 2019-04-11: 8 mg via INTRAVENOUS

## 2019-04-11 SURGICAL SUPPLY — 60 items
ARTISURF 10M PLY R 6-9CD KNEE (Knees) ×1 IMPLANT
BAG SPEC THK2 15X12 ZIP CLS (MISCELLANEOUS) ×1
BAG ZIPLOCK 12X15 (MISCELLANEOUS) ×2 IMPLANT
BLADE SAGITTAL 13X1.27X60 (BLADE) ×2 IMPLANT
BLADE SAW SGTL 83.5X18.5 (BLADE) ×2 IMPLANT
BLADE SURG 15 STRL LF DISP TIS (BLADE) ×1 IMPLANT
BLADE SURG 15 STRL SS (BLADE) ×2
BLADE SURG SZ10 CARB STEEL (BLADE) ×4 IMPLANT
BNDG ELASTIC 6X5.8 VLCR STR LF (GAUZE/BANDAGES/DRESSINGS) ×2 IMPLANT
BOWL SMART MIX CTS (DISPOSABLE) ×2 IMPLANT
BSPLAT TIB 5D D CMNT STM RT (Knees) ×1 IMPLANT
CEMENT BONE SIMPLEX SPEEDSET (Cement) ×4 IMPLANT
COVER SURGICAL LIGHT HANDLE (MISCELLANEOUS) ×2 IMPLANT
COVER WAND RF STERILE (DRAPES) IMPLANT
CUFF TOURN SGL QUICK 34 (TOURNIQUET CUFF) ×2
CUFF TRNQT CYL 34X4.125X (TOURNIQUET CUFF) ×1 IMPLANT
DECANTER SPIKE VIAL GLASS SM (MISCELLANEOUS) ×5 IMPLANT
DRAPE INCISE IOBAN 66X45 STRL (DRAPES) ×4 IMPLANT
DRAPE U-SHAPE 47X51 STRL (DRAPES) ×2 IMPLANT
DRSG AQUACEL AG ADV 3.5X10 (GAUZE/BANDAGES/DRESSINGS) ×2 IMPLANT
DURAPREP 26ML APPLICATOR (WOUND CARE) ×4 IMPLANT
ELECT REM PT RETURN 15FT ADLT (MISCELLANEOUS) ×2 IMPLANT
FEMUR  CMT CCR STD SZ8 R KNEE (Knees) ×1 IMPLANT
FEMUR CMT CCR STD SZ8 R KNEE (Knees) ×1 IMPLANT
FEMUR CMTD CCR STD SZ8 R KNEE (Knees) IMPLANT
GLOVE BIOGEL M STRL SZ7.5 (GLOVE) ×1 IMPLANT
GLOVE BIOGEL PI IND STRL 7.5 (GLOVE) ×1 IMPLANT
GLOVE BIOGEL PI IND STRL 8.5 (GLOVE) ×2 IMPLANT
GLOVE BIOGEL PI INDICATOR 7.5 (GLOVE)
GLOVE BIOGEL PI INDICATOR 8.5 (GLOVE) ×2
GLOVE SURG ORTHO 8.0 STRL STRW (GLOVE) ×6 IMPLANT
GOWN STRL REUS W/ TWL XL LVL3 (GOWN DISPOSABLE) ×2 IMPLANT
GOWN STRL REUS W/TWL XL LVL3 (GOWN DISPOSABLE) ×4
HANDPIECE INTERPULSE COAX TIP (DISPOSABLE) ×2
HDLS TROCR DRIL PIN KNEE 75 (PIN) ×1
HOLDER FOLEY CATH W/STRAP (MISCELLANEOUS) ×1 IMPLANT
HOOD PEEL AWAY FLYTE STAYCOOL (MISCELLANEOUS) ×6 IMPLANT
KIT TURNOVER KIT A (KITS) IMPLANT
MANIFOLD NEPTUNE II (INSTRUMENTS) ×2 IMPLANT
NEEDLE HYPO 22GX1.5 SAFETY (NEEDLE) ×2 IMPLANT
NS IRRIG 1000ML POUR BTL (IV SOLUTION) ×2 IMPLANT
PACK TOTAL KNEE CUSTOM (KITS) ×2 IMPLANT
PENCIL SMOKE EVACUATOR (MISCELLANEOUS) IMPLANT
PIN DRILL HDLS TROCAR 75 4PK (PIN) IMPLANT
PROTECTOR NERVE ULNAR (MISCELLANEOUS) ×2 IMPLANT
SET HNDPC FAN SPRY TIP SCT (DISPOSABLE) ×1 IMPLANT
STEM POLY PAT PLY 32M KNEE (Knees) ×1 IMPLANT
STEM TIBIA 5 DEG SZ D R KNEE (Knees) IMPLANT
STRIP CLOSURE SKIN 1/2X4 (GAUZE/BANDAGES/DRESSINGS) ×2 IMPLANT
SUT BONE WAX W31G (SUTURE) ×2 IMPLANT
SUT MNCRL AB 3-0 PS2 18 (SUTURE) ×2 IMPLANT
SUT STRATAFIX 0 PDS 27 VIOLET (SUTURE) ×2
SUT STRATAFIX PDS+ 0 24IN (SUTURE) ×2 IMPLANT
SUT VIC AB 1 CT1 36 (SUTURE) ×2 IMPLANT
SUTURE STRATFX 0 PDS 27 VIOLET (SUTURE) ×1 IMPLANT
SYR CONTROL 10ML LL (SYRINGE) ×4 IMPLANT
TIBIA STEM 5 DEG SZ D R KNEE (Knees) ×2 IMPLANT
WATER STERILE IRR 1000ML POUR (IV SOLUTION) ×4 IMPLANT
WRAP KNEE MAXI GEL POST OP (GAUZE/BANDAGES/DRESSINGS) ×2 IMPLANT
YANKAUER SUCT BULB TIP 10FT TU (MISCELLANEOUS) ×2 IMPLANT

## 2019-04-11 NOTE — Anesthesia Postprocedure Evaluation (Signed)
Anesthesia Post Note  Patient: Ariel Fisher  Procedure(s) Performed: TOTAL KNEE ARTHROPLASTY (Right Knee)     Patient location during evaluation: PACU Anesthesia Type: Spinal Level of consciousness: oriented and awake and alert Pain management: pain level controlled Vital Signs Assessment: post-procedure vital signs reviewed and stable Respiratory status: spontaneous breathing, respiratory function stable and nonlabored ventilation Cardiovascular status: blood pressure returned to baseline and stable Postop Assessment: no headache, no backache, no apparent nausea or vomiting and spinal receding Anesthetic complications: no    Last Vitals:  Vitals:   04/11/19 1045 04/11/19 1054  BP: (!) 150/94 127/87  Pulse: 78 78  Resp: 12 14  Temp: (!) 36.3 C 36.6 C  SpO2: 95% 93%    Last Pain:  Vitals:   04/11/19 1145  TempSrc:   PainSc: 5                  Lidia Collum

## 2019-04-11 NOTE — Progress Notes (Signed)
Pt discharged in NAD, VSS, pain tolerable. Pt given discharge instructions, all questions answered. Pt discharged with belongings. Pt discharged by wheelchair home with husband.

## 2019-04-11 NOTE — Transfer of Care (Signed)
Immediate Anesthesia Transfer of Care Note  Patient: Ariel Fisher   Procedure(s) Performed: TOTAL KNEE ARTHROPLASTY (Right Knee)  Patient Location: PACU  Anesthesia Type:Spinal  Level of Consciousness: awake, alert , oriented and patient cooperative  Airway & Oxygen Therapy: Patient Spontanous Breathing and Patient connected to face mask oxygen  Post-op Assessment: Report given to RN and Post -op Vital signs reviewed and stable  Post vital signs: Reviewed and stable  Last Vitals:  Vitals Value Taken Time  BP 111/83 04/11/19 0915  Temp    Pulse 70 04/11/19 0916  Resp 12 04/11/19 0916  SpO2 95 % 04/11/19 0916  Vitals shown include unvalidated device data.  Last Pain:  Vitals:   04/11/19 0602  TempSrc: Oral         Complications: No apparent anesthesia complications

## 2019-04-11 NOTE — Anesthesia Procedure Notes (Signed)
Spinal  Patient location during procedure: OR Staffing Performed: anesthesiologist  Anesthesiologist: Shadawn Hanaway E, MD Preanesthetic Checklist Completed: patient identified, IV checked, risks and benefits discussed, surgical consent, monitors and equipment checked, pre-op evaluation and timeout performed Spinal Block Patient position: sitting Prep: DuraPrep and site prepped and draped Patient monitoring: continuous pulse ox, blood pressure and heart rate Approach: midline Location: L3-4 Injection technique: single-shot Needle Needle type: Quincke  Needle gauge: 22 G Needle length: 9 cm Additional Notes Functioning IV was confirmed and monitors were applied. Sterile prep and drape, including hand hygiene and sterile gloves were used. The patient was positioned and the spine was prepped. The skin was anesthetized with lidocaine.  Free flow of clear CSF was obtained prior to injecting local anesthetic into the CSF. The needle was carefully withdrawn. The patient tolerated the procedure well.      

## 2019-04-11 NOTE — Evaluation (Signed)
Physical Therapy Evaluation Patient Details Name: Ariel Fisher MRN: [id number] DOB: May 31, 1945 Today's Date: 04/11/2019   History of Present Illness  Patient is 73 y.o. 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    Clinical Impression  Ariel Fisher is a 73 y.o. female POD 0 s/p Rt TKA. Patient reports independence with mobility at baseline. Patient is now limited by functional impairments (see PT problem list below) and requires supervision/min guard for transfers and gait with RW. Patient was able to ambulate ~60 feet with RW and min guard assist. She was limited during session by onset of dizziness and lightheaded sensation after gait and required seated rest. Patient instructed in exercise to facilitate circulation. Patient is hoping to go home today and will require additional therapy session for gait/stair training, and HEP instruction. She will benefit from continued skilled PT interventions to address impairments and progress towards PLOF. Acute PT will follow to progress mobility and stair training in preparation for safe discharge home.    Follow Up Recommendations      Equipment Recommendations       Recommendations for Other Services       Precautions / Restrictions Precautions Precautions: Fall Restrictions Weight Bearing Restrictions: No      Mobility  Bed Mobility Overal bed mobility: Needs Assistance Bed Mobility: Supine to Sit     Supine to sit: Supervision;HOB elevated     General bed mobility comments: cues for sequencing and to avoid use of bed rail, no physical assistance required  Transfers Overall transfer level: Needs assistance Equipment used: Rolling walker (2 wheeled) Transfers: Sit to/from Stand Sit to Stand: Min guard         General transfer comment: cues for safe hand placement and technique with RW, no assist required for power up from EOB or  BSC.  Ambulation/Gait Ambulation/Gait assistance: Min guard Gait Distance (Feet): 60 Feet Assistive device: Rolling walker (2 wheeled) Gait Pattern/deviations: Step-to pattern;Decreased stride length;Decreased stance time - right;Decreased weight shift to right Gait velocity: decreased   General Gait Details: verbal cues for safe step pattern within RW and maintain safe proximity. no overt LOB noted. pt's Rt knee buckling slightly on 3 occasions, pt able to self correct and use UE support to prevent LOB.  Stairs            Wheelchair Mobility    Modified Rankin (Stroke Patients Only)       Balance Overall balance assessment: Needs assistance Sitting-balance support: Feet supported;No upper extremity supported Sitting balance-Leahy Scale: Good     Standing balance support: Bilateral upper extremity supported;During functional activity Standing balance-Leahy Scale: Poor                Pertinent Vitals/Pain Pain Assessment: Faces Faces Pain Scale: Hurts little more Pain Location: Rt knee Pain Descriptors / Indicators: Aching;Sore Pain Intervention(s): Monitored during session;Repositioned    Home Living Family/patient expects to be discharged to:: Private residence Living Arrangements: Spouse/significant other;Children;Other relatives Available Help at Discharge: Family;Available 24 hours/day Type of Home: House Home Access: Stairs to enter Entrance Stairs-Rails: None Entrance Stairs-Number of Steps: 3 Home Layout: Full bath on main level;Two level;Able to live on main level with bedroom/bathroom Home Equipment: Gilford Rile - 2 wheels;Bedside commode;Shower seat;Cane - single point Additional Comments: pt lives in home with her husband, daughter, and grandchildren, they are all working from home or in virtual school, she lives on main floor with her husband.  Prior Function Level of Independence: Independent;Independent with assistive device(s)          Comments: pt was independent prior to surgeries, progressed to [facility] durign recovery from Lt TKA in October and has still been using it prior this surgery.     Hand Dominance   Dominant Hand: Right    Extremity/Trunk Assessment   Upper Extremity Assessment Upper Extremity Assessment: Overall WFL for tasks assessed    Lower Extremity Assessment Lower Extremity Assessment: RLE deficits/detail RLE Deficits / Details: no extensor lag with SLR, 4-/5 for quad strength with MMT, some buckling noted in weight bearing, pt able to use UE support on RW RLE Sensation: WNL RLE Coordination: WNL    Cervical / Trunk Assessment Cervical / Trunk Assessment: Normal  Communication   Communication: No difficulties  Cognition Arousal/Alertness: Awake/alert Behavior During Therapy: WFL for tasks assessed/performed Overall Cognitive Status: Within Functional Limits for tasks assessed             General Comments      Exercises Total Joint Exercises Ankle Circles/Pumps: AROM;10 reps;Both;Seated   Assessment/Plan    PT Assessment Patient needs continued PT services  PT Problem List Decreased strength;Decreased activity tolerance;Decreased balance;Decreased range of motion;Decreased mobility;Decreased knowledge of use of DME       PT Treatment Interventions DME instruction;Functional mobility training;Balance training;Patient/family education;Therapeutic activities;Gait training;Stair training;Therapeutic exercise    PT Goals (Current goals can be found in the Care Plan section)  Acute Rehab PT Goals Patient Stated Goal: to go home today PT Goal Formulation: With patient Time For Goal Achievement: 04/18/19 Potential to Achieve Goals: Good    Frequency 7X/week    AM-PAC PT "6 Clicks" Mobility  Outcome Measure Help needed turning from your back to your side while in a flat bed without using bedrails?: A Little Help needed moving from lying on your back to sitting on the side of a  flat bed without using bedrails?: A Little Help needed moving to and from a bed to a chair (including a wheelchair)?: A Little Help needed standing up from a chair using your arms (e.g., wheelchair or bedside chair)?: A Little Help needed to walk in hospital room?: A Little Help needed climbing 3-5 steps with a railing? : A Little 6 Click Score: 18    End of Session Equipment Utilized During Treatment: Gait belt Activity Tolerance: Patient tolerated treatment well Patient left: in chair;with call bell/phone within reach Nurse Communication: Mobility status PT Visit Diagnosis: Muscle weakness (generalized) (M62.81);Difficulty in walking, not elsewhere classified (R26.2)    Time: 9485-4627 PT Time Calculation (min) (ACUTE ONLY): 27 min   Charges:   PT Evaluation $PT Eval Low Complexity: 1 Low PT Treatments $Gait Training: 8-22 mins        Valentino Saxon, PT, DPT Physical Therapist with [facility]  04/11/2019 1:58 PM

## 2019-04-11 NOTE — Anesthesia Procedure Notes (Signed)
Anesthesia Regional Block: Adductor canal block   Pre-Anesthetic Checklist: ,, timeout performed, Correct Patient, Correct Site, Correct Laterality, Correct Procedure, Correct Position, site marked, Risks and benefits discussed,  Surgical consent,  Pre-op evaluation,  At surgeon's request and post-op pain management  Laterality: Right  Prep: chloraprep       Needles:  Injection technique: Single-shot  Needle Type: Echogenic Stimulator Needle     Needle Length: 10cm  Needle Gauge: 21     Additional Needles:   Procedures:,,,, ultrasound used (permanent image in chart),,,,  Narrative:  Start time: 04/11/2019 7:05 AM End time: 04/11/2019 7:10 AM Injection made incrementally with aspirations every 5 mL.  Performed by: Personally  Anesthesiologist: Lidia Collum, MD  Additional Notes: Monitors applied. Injection made in 5cc increments. No resistance to injection. Good needle visualization. Patient tolerated procedure well.

## 2019-04-11 NOTE — Progress Notes (Signed)
Physical Therapy Treatment Patient Details Name: Ariel Fisher MRN: [id number] DOB: July 09, 1945 Today's Date: 04/11/2019    History of Present Illness Patient is 73 y.o. 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    PT Comments    Patient demonstrated good carryover for safe hand placement and technique with RW for transfers and gait from first session. She was instructed in stair mobility to ascend/descend with RW and step to pattern, min assist required to stabilize walker on stairs. Pt did become lightheaded, dizzy, and overheated after ~50' of gait and required a seated rest break and LE elevation to reduce symptoms, BP was 100/67 mmHg after ~ 1 minute sitting. She was instructed in exercises for HEP. Pt will benefit from additional skilled PT to progress functional mobility and tolerance to standing/gait activities in preparation for safe discharge home.    Follow Up Recommendations  Follow surgeon's recommendation for DC plan and follow-up therapies     Equipment Recommendations  None recommended by PT    Recommendations for Other Services       Precautions / Restrictions Precautions Precautions: Fall Restrictions Weight Bearing Restrictions: No    Mobility  Bed Mobility Overal bed mobility: Needs Assistance Bed Mobility: Supine to Sit     Supine to sit: Supervision;HOB elevated     General bed mobility comments: pt OOB at start of session in recliner  Transfers Overall transfer level: Needs assistance Equipment used: Rolling walker (2 wheeled) Transfers: Sit to/from Stand Sit to Stand: Min guard         General transfer comment: pt demonstrated good carryover for safe hand placement and technique with RW, no assist required for power upp or controlled lowering  Ambulation/Gait Ambulation/Gait assistance: Min guard Gait Distance (Feet): 50 Feet Assistive device: Rolling walker (2  wheeled) Gait Pattern/deviations: Step-to pattern;Decreased stride length;Decreased stance time - right;Decreased weight shift to right Gait velocity: decreased   General Gait Details: pt maintained safe hand placement and proximity to RW, Rt knee continues to have slight buckling but pt with good carryover from earlier session on use of UE's to prevent this. Pt became light headed, dizzy, and overheated after ~50' of walking and stair training. she required seated rest break.   Stairs Stairs: Yes Stairs assistance: Min guard;Min assist Stair Management: No rails;With walker;Step to pattern;Backwards;Forwards Number of Stairs: 3 General stair comments: verbal cues to review safe step sequencing "up wtih good, down with bad". assist to steady RW for support and to mobilize walker on stairs to ascend/descend 1 step at a time.   Wheelchair Mobility    Modified Rankin (Stroke Patients Only)       Balance Overall balance assessment: Needs assistance Sitting-balance support: Feet supported;No upper extremity supported Sitting balance-Leahy Scale: Good     Standing balance support: Bilateral upper extremity supported;During functional activity Standing balance-Leahy Scale: Poor           Cognition Arousal/Alertness: Awake/alert Behavior During Therapy: WFL for tasks assessed/performed Overall Cognitive Status: Within Functional Limits for tasks assessed                 Exercises Total Joint Exercises Ankle Circles/Pumps: AROM;10 reps;Both;Seated Quad Sets: AROM;5 reps;Seated;Right Short Arc Quad: AROM;5 reps;Seated;Right Heel Slides: AAROM;5 reps;Seated;Right Hip ABduction/ADduction: AROM;5 reps;Seated;Right Straight Leg Raises: AROM;5 reps;Seated;Right Long Arc Quad: AROM;5 reps;Seated;Right Knee Flexion: AROM;AAROM;5 reps;Seated;Right    General Comments        Pertinent Vitals/Pain Pain Assessment:  Faces Faces Pain Scale: Hurts whole lot Pain Location: Rt  knee Pain Descriptors / Indicators: Aching;Sore Pain Intervention(s): Limited activity within patient's tolerance;Monitored during session    [place] expects to be discharged to:: Private residence Living Arrangements: Spouse/significant other;Children;Other relatives Available Help at Discharge: Family;Available 24 hours/day Type of Home: House Home Access: Stairs to enter Entrance Stairs-Rails: None Home Layout: Full bath on main level;Two level;Able to live on main level with bedroom/bathroom Home Equipment: Gilford Rile - 2 wheels;Bedside commode;Shower seat;Cane - single point Additional Comments: pt lives in home with her husband, daughter, and grandchildren, they are all working from home or in virtual school, she lives on main floor with her husband.    Prior Function Level of Independence: Independent;Independent with assistive device(s)      Comments: pt was independent prior to surgeries, progressed to [place] recovery from Lt TKA in October and has still been using it prior this surgery.   PT Goals (current goals can now be found in the care plan section) Acute Rehab PT Goals Patient Stated Goal: to go home today PT Goal Formulation: With patient Time For Goal Achievement: 04/18/19 Potential to Achieve Goals: Good Progress towards PT goals: Progressing toward goals    Frequency    7X/week      PT Plan Current plan remains appropriate       AM-PAC PT "6 Clicks" Mobility   Outcome Measure  Help needed turning from your back to your side while in a flat bed without using bedrails?: A Little Help needed moving from lying on your back to sitting on the side of a flat bed without using bedrails?: A Little Help needed moving to and from a bed to a chair (including a wheelchair)?: A Little Help needed standing up from a chair using your arms (e.g., wheelchair or bedside chair)?: A Little Help needed to walk in hospital room?: A Little Help needed  climbing 3-5 steps with a railing? : A Little 6 Click Score: 18    End of Session Equipment Utilized During Treatment: Gait belt Activity Tolerance: Patient tolerated treatment well Patient left: in chair;with call bell/phone within reach Nurse Communication: Mobility status PT Visit Diagnosis: Muscle weakness (generalized) (M62.81);Difficulty in walking, not elsewhere classified (R26.2)     Time: 6789-3810 PT Time Calculation (min) (ACUTE ONLY): 26 min  Charges:  $Gait Training: 8-22 mins $Therapeutic Exercise: 8-22 mins                     Gwynneth Albright PT, DPT Physical Therapist with [facility]  04/11/2019 3:54 PM

## 2019-04-11 NOTE — Progress Notes (Signed)
Physical Therapy Treatment Patient Details Name: Ariel Fisher MRN: [id number] DOB: Dec 09, 1945 Today's Date: 04/11/2019    History of Present Illness Patient is 73 y.o. 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    PT Comments    Patient had good carryover for safety and technique with RW for gait and transfers, no overt LOB during gait and pt completed with no reports of dizziness and lightheadedness. She has previously completed all exercises for HEP and stair training and is now safe to discharge home with assist from family. Acute PT will follow if she remains in acute setting.   Follow Up Recommendations  Follow surgeon's recommendation for DC plan and follow-up therapies     Equipment Recommendations  None recommended by PT    Recommendations for Other Services       Precautions / Restrictions Precautions Precautions: Fall Restrictions Weight Bearing Restrictions: No    Mobility  Bed Mobility               General bed mobility comments: pt OOB at start of session in recliner  Transfers Overall transfer level: Needs assistance Equipment used: Rolling walker (2 wheeled) Transfers: Sit to/from Stand Sit to Stand: Min guard         General transfer comment: pt demonstrated good carryover for safe hand placement and technique with RW, no assist required for power upp or controlled lowering  Ambulation/Gait Ambulation/Gait assistance: Min guard Gait Distance (Feet): 80 Feet Assistive device: Rolling walker (2 wheeled) Gait Pattern/deviations: Step-to pattern;Decreased stride length;Decreased stance time - right;Decreased weight shift to right Gait velocity: decreased   General Gait Details: good carryover for safe step pattern in RW and safe proximity, no overt LOB noted.   Stairs             Wheelchair Mobility    Modified Rankin (Stroke Patients Only)       Balance  Overall balance assessment: Needs assistance Sitting-balance support: Feet supported;No upper extremity supported Sitting balance-Leahy Scale: Good     Standing balance support: Bilateral upper extremity supported;During functional activity Standing balance-Leahy Scale: Poor               Cognition Arousal/Alertness: Awake/alert Behavior During Therapy: WFL for tasks assessed/performed Overall Cognitive Status: Within Functional Limits for tasks assessed               Exercises      General Comments        Pertinent Vitals/Pain Pain Assessment: Faces Faces Pain Scale: Hurts even more Pain Location: Rt knee Pain Descriptors / Indicators: Aching;Sore Pain Intervention(s): Monitored during session           PT Goals (current goals can now be found in the care plan section) Acute Rehab PT Goals Patient Stated Goal: to go home today PT Goal Formulation: With patient Time For Goal Achievement: 04/18/19 Potential to Achieve Goals: Good Progress towards PT goals: Progressing toward goals    Frequency    7X/week      PT Plan Current plan remains appropriate       AM-PAC PT "6 Clicks" Mobility   Outcome Measure  Help needed turning from your back to your side while in a flat bed without using bedrails?: A Little Help needed moving from lying on your back to sitting on the side of a flat bed without using bedrails?: A Little Help needed moving to and from a bed to a  chair (including a wheelchair)?: A Little Help needed standing up from a chair using your arms (e.g., wheelchair or bedside chair)?: A Little Help needed to walk in hospital room?: A Little Help needed climbing 3-5 steps with a railing? : A Little 6 Click Score: 18    End of Session Equipment Utilized During Treatment: Gait belt Activity Tolerance: Patient tolerated treatment well Patient left: in chair;with call bell/phone within reach Nurse Communication: Mobility status PT Visit  Diagnosis: Muscle weakness (generalized) (M62.81);Difficulty in walking, not elsewhere classified (R26.2)     Time: 5852-7782 PT Time Calculation (min) (ACUTE ONLY): 11 min  Charges:  $Gait Training: 8-22 mins                     Gwynneth Albright PT, DPT Physical Therapist with [facility]  04/11/2019 8:20 PM

## 2019-04-11 NOTE — H&P (Signed)
Ariel Fisher MRN:  [id number] DOB/SEX:  Oct 27, 1945/female  CHIEF COMPLAINT:  Painful right Knee  HISTORY: Patient is a 73 y.o. female presented with a history of pain in the right knee. Onset of symptoms was gradual starting a few years ago with gradually worsening course since that time. Patient has been treated conservatively with over-the-counter NSAIDs and activity modification. Patient currently rates pain in the knee at 10 out of 10 with activity. There is pain at night.  PAST MEDICAL HISTORY: Patient Active Problem List   Diagnosis Date Noted  . 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 Spondylolisthesis of lumbar region 08/08/2013   Past Medical History:  Diagnosis Date  . Anxiety   . Depression   . Hypertension    Past Surgical History:  Procedure Laterality Date  . CARDIAC CATHETERIZATION     15 yrs ago ..clean  . CESAREAN SECTION    . TOTAL KNEE ARTHROPLASTY Left 02/14/2019   Procedure: TOTAL KNEE ARTHROPLASTY;  Surgeon: Vickey Huger, MD;  Location: [facility];  Service: Orthopedics;  Laterality: Left;  75 mins needed for length of case     MEDICATIONS:   Medications Prior to Admission  Medication Sig Dispense Refill Last Dose  . acetaminophen (TYLENOL) 500 MG tablet Take 500 mg by mouth every 6 (six) hours as needed (pain).   Past Week at Unknown time  . ALPRAZolam (XANAX) 0.25 MG tablet Take 0.25 mg by mouth at bedtime as needed for sleep.    Past Week at Unknown time  . amLODipine (NORVASC) 2.5 MG tablet Take 2.5 mg by mouth at bedtime.    Past Week at Unknown time  . gabapentin (NEURONTIN) 600 MG tablet Take 600 mg by mouth at bedtime as needed (pain).    Past Week at Unknown time  . ibuprofen (ADVIL) 200 MG tablet Take 800 mg by mouth every 8 (eight) hours as needed (pain).   Past Month at Unknown time  . meloxicam (MOBIC) 15 MG tablet Take 15 mg by mouth daily as needed for pain (Takes about once a week).   Past Month at Unknown time  . Omega-3 Fatty Acids  (FISH OIL) 500 MG CAPS Take 500 mg by mouth daily.      Marland Kitchen OVER THE COUNTER MEDICATION Take 1 tablet by mouth daily. Keto supplement       ALLERGIES:   Allergies  Allergen Reactions  . Tramadol Other (See Comments)    Hives, nausea, dizziness    REVIEW OF SYSTEMS:  A comprehensive review of systems was negative except for: Musculoskeletal: positive for arthralgias and bone pain   FAMILY HISTORY:  History reviewed. No pertinent family history.  SOCIAL HISTORY:   Social History   Tobacco Use  . Smoking status: Never Smoker  . Smokeless tobacco: Never Used  Substance Use Topics  . Alcohol use: Yes    Comment: occ     EXAMINATION:  Vital signs in last 24 hours: Temp:  [97.8 F (36.6 C)] 97.8 F (36.6 C) (12/21 0602) Pulse Rate:  [83] 83 (12/21 0602) Resp:  [17] 17 (12/21 0602) BP: (126)/(75) 126/75 (12/21 0602) SpO2:  [97 %] 97 % (12/21 0602) Weight:  [892 kg] 112 kg (12/21 0541)  BP 126/75   Pulse 83   Temp 97.8 F (36.6 C) (Oral)   Resp 17   Wt 112 kg   SpO2 97%   BMI 38.11 kg/m   General Appearance:    Alert, cooperative, no distress, appears stated age  Head:    Normocephalic, without obvious abnormality, atraumatic  Eyes:    PERRL, conjunctiva/corneas clear, EOM's intact, fundi    benign, both eyes  Ears:    Normal TM's and external ear canals, both ears  Nose:   Nares normal, septum midline, mucosa normal, no drainage    or sinus tenderness  Throat:   Lips, mucosa, and tongue normal; teeth and gums normal  Neck:   Supple, symmetrical, trachea midline, no adenopathy;    thyroid:  no enlargement/tenderness/nodules; no carotid   bruit or JVD  Back:     Symmetric, no curvature, ROM normal, no CVA tenderness  Lungs:     Clear to auscultation bilaterally, respirations unlabored  Chest Wall:    No tenderness or deformity   Heart:    Regular rate and rhythm, S1 and S2 normal, no murmur, rub   or gallop  Breast Exam:    No tenderness, masses, or nipple  abnormality  Abdomen:     Soft, non-tender, bowel sounds active all four quadrants,    no masses, no organomegaly  Genitalia:    Normal female without lesion, discharge or tenderness  Rectal:    Normal tone, no masses or tenderness;   guaiac negative stool  Extremities:   Extremities normal, atraumatic, no cyanosis or edema  Pulses:   2+ and symmetric all extremities  Skin:   Skin color, texture, turgor normal, no rashes or lesions  Lymph nodes:   Cervical, supraclavicular, and axillary nodes normal  Neurologic:   CNII-XII intact, normal strength, sensation and reflexes    throughout    Musculoskeletal:  ROM 0-120, Ligaments intact,  Imaging Review Plain radiographs demonstrate severe degenerative joint disease of the right knee. The overall alignment is neutral. The bone quality appears to be good for age and reported activity level.  Assessment/Plan: Primary osteoarthritis, right knee   The patient history, physical examination and imaging studies are consistent with advanced degenerative joint disease of the right knee. The patient has failed conservative treatment.  The clearance notes were reviewed.  After discussion with the patient it was felt that Total Knee Replacement was indicated. The procedure,  risks, and benefits of total knee arthroplasty were presented and reviewed. The risks including but not limited to aseptic loosening, infection, blood clots, vascular injury, stiffness, patella tracking problems complications among others were discussed. The patient acknowledged the explanation, agreed to proceed with the plan.  Preoperative templating of the joint replacement has been completed, documented, and submitted to the Operating Room personnel in order to optimize intra-operative equipment management.    Patient's anticipated LOS is less than 2 midnights, meeting these requirements: - Lives within 1 hour of care - Has a competent adult at home to recover with post-op  recover - NO history of  - Chronic pain requiring opiods  - Diabetes  - Coronary Artery Disease  - Heart failure  - Heart attack  - Stroke  - DVT/VTE  - Cardiac arrhythmia  - Respiratory Failure/COPD  - Renal failure  - Anemia  - Advanced Liver disease       Guy Sandifer 04/11/2019, 7:00 AM

## 2019-04-12 ENCOUNTER — Encounter: Payer: Self-pay | Admitting: *Deleted

## 2019-04-13 NOTE — Op Note (Signed)
TOTAL KNEE REPLACEMENT OPERATIVE NOTE:  04/11/2019  12:16 PM  PATIENT:  Ariel Fisher  73 y.o. female  PRE-OPERATIVE DIAGNOSIS:  Primary Osteoarthritis Right Knee  POST-OPERATIVE DIAGNOSIS:  Primary Osteoarthritis Right Knee  PROCEDURE:  Procedure(s): TOTAL KNEE ARTHROPLASTY  SURGEON:  Surgeon(s): Dannielle Huh, MD  PHYSICIAN ASSISTANT: Laurier Nancy, PA-C   ANESTHESIA:   spinal  SPECIMEN: None  COUNTS:  Correct  TOURNIQUET:   Total Tourniquet Time Documented: Thigh (Right) - 37 minutes Total: Thigh (Right) - 37 minutes   DICTATION:  Indication for procedure:    The patient is a 73 y.o. female who has failed conservative treatment for Primary Osteoarthritis Right Knee.  Informed consent was obtained prior to anesthesia. The risks versus benefits of the operation were explain and in a way the patient can, and did, understand.    Description of procedure:     The patient was taken to the operating room and placed under anesthesia.  The patient was positioned in the usual fashion taking care that all body parts were adequately padded and/or protected.  A tourniquet was applied and the leg prepped and draped in the usual sterile fashion.  The extremity was exsanguinated with the esmarch and tourniquet inflated to 350 mmHg.  Pre-operative range of motion was normal.   A midline incision approximately 6-7 inches long was made with a #10 blade.  A new blade was used to make a parapatellar arthrotomy going 2-3 cm into the quadriceps tendon, over the patella, and alongside the medial aspect of the patellar tendon.  A synovectomy was then performed with the #10 blade and forceps. I then elevated the deep MCL off the medial tibial metaphysis subperiosteally around to the semimembranosus attachment.    I everted the patella and used calipers to measure patellar thickness.  I used the reamer to ream down to appropriate thickness to recreate the native thickness.  I then removed  excess bone with the rongeur and sagittal saw.  I used the appropriately sized template and drilled the three lug holes.  I then put the trial in place and measured the thickness with the calipers to ensure recreation of the native thickness.  The trial was then removed and the patella subluxed and the knee brought into flexion.  A homan retractor was place to retract and protect the patella and lateral structures.  A Z-retractor was place medially to protect the medial structures.  The extra-medullary alignment system was used to make cut the tibial articular surface perpendicular to the anamotic axis of the tibia and in 3 degrees of posterior slope.  The cut surface and alignment jig was removed.  I then used the intramedullary alignment guide to make a  valgus cut on the distal femur.  I then marked out the epicondylar axis on the distal femur.   I then used the anterior referencing sizer and measured the femur to be a size 8.  The 4-In-1 cutting block was screwed into place in external rotation matching the posterior condylar angle, making our cuts perpendicular to the epicondylar axis.  Anterior, posterior and chamfer cuts were made with the sagittal saw.  The cutting block and cut pieces were removed.  A lamina spreader was placed in 90 degrees of flexion.  The ACL, PCL, menisci, and posterior condylar osteophytes were removed.  A 10 mm spacer blocked was found to offer good flexion and extension gap balance after minimal in degree releasing.   The scoop retractor was then placed and the  femoral finishing block was pinned in place.  The small sagittal saw was used as well as the lug drill to finish the femur.  The block and cut surfaces were removed and the medullary canal hole filled with autograft bone from the cut pieces.  The tibia was delivered forward in deep flexion and external rotation.  A size D tray was selected and pinned into place centered on the medial 1/3 of the tibial tubercle.  The  reamer and keel was used to prepare the tibia through the tray.    I then trialed with the size 8 femur, size D tibia, a 10 mm insert and the 32 patella.  I had excellent flexion/extension gap balance, excellent patella tracking.  Flexion was full and beyond 120 degrees; extension was zero.  These components were chosen and the staff opened them to me on the back table while the knee was lavaged copiously and the cement mixed.  The soft tissue was infiltrated with 60cc of exparel 1.3% through a 21 gauge needle.  I cemented in the components and removed all excess cement.  The polyethylene tibial component was snapped into place and the knee placed in extension while cement was hardening.  The capsule was infilltrated with a 60cc exparel/marcaine/saline mixture.   Once the cement was hard, the tourniquet was let down.  Hemostasis was obtained.  The arthrotomy was closed using a #1 stratofix running suture.  The deep soft tissues were closed with #0 vicryls and the subcuticular layer closed with #2-0 vicryl.  The skin was reapproximated and closed with 3.0 Monocryl.  The wound was covered with steristrips, aquacel dressing, and a TED stocking.   The patient was then awakened, extubated, and taken to the recovery room in stable condition.  BLOOD LOSS:  697XY COMPLICATIONS:  None.  PLAN OF CARE: Discharge to home after PACU  PATIENT DISPOSITION:  PACU - hemodynamically stable.    Please fax a copy of this op note to my office at [phone] (please only include page 1 and 2 of the Case Information op note)

## 2019-06-12 ENCOUNTER — Other Ambulatory Visit: Payer: Self-pay

## 2019-06-12 ENCOUNTER — Ambulatory Visit: Payer: Medicare HMO | Attending: Internal Medicine

## 2019-06-12 DIAGNOSIS — Z23 Encounter for immunization: Secondary | ICD-10-CM | POA: Insufficient documentation

## 2019-06-12 NOTE — Progress Notes (Signed)
   Covid-19 Vaccination Clinic  Name:  Shaily Librizzi    MRN: [id number] DOB: [birth year]  06/12/2019  Ms. Pricer was observed post Covid-19 immunization for 15 minutes without incidence. She was provided with Vaccine Information Sheet and instruction to access the V-Safe system.   Ms. Kihn was instructed to call 911 with any severe reactions post vaccine: Marland Kitchen Difficulty breathing  . Swelling of your face and throat  . A fast heartbeat  . A bad rash all over your body  . Dizziness and weakness    Immunizations Administered    Name Date Dose VIS Date Route   Pfizer COVID-19 Vaccine 06/12/2019  8:44 AM 0.3 mL 04/01/2019 Intramuscular   Manufacturer: [employer], [employer]   Lot: [id number]   NDC: [address]-6

## 2019-07-05 ENCOUNTER — Ambulatory Visit: Payer: Medicare HMO | Attending: Internal Medicine

## 2019-07-05 DIAGNOSIS — Z23 Encounter for immunization: Secondary | ICD-10-CM

## 2019-07-05 NOTE — Progress Notes (Signed)
   Covid-19 Vaccination Clinic  Name:  Ariel Fisher    MRN: [id number] DOB: December 12, 1945  07/05/2019  [name] was observed post Covid-19 immunization for 15 minutes without incident. She was provided with Vaccine Information Sheet and instruction to access the V-Safe system.   [name] was instructed to call 911 with any severe reactions post vaccine: Marland Kitchen Difficulty breathing  . Swelling of face and throat  . A fast heartbeat  . A bad rash all over body  . Dizziness and weakness   Immunizations Administered    Name Date Dose VIS Date Route   Pfizer COVID-19 Vaccine 07/05/2019  8:57 AM 0.3 mL 04/01/2019 Intramuscular   Manufacturer: [employer], [employer]   Lot: [id number]   NDC: [address]-2

## 2019-11-29 ENCOUNTER — Other Ambulatory Visit: Payer: Self-pay | Admitting: Family Medicine

## 2020-01-12 ENCOUNTER — Other Ambulatory Visit: Payer: Self-pay | Admitting: Internal Medicine

## 2020-01-12 DIAGNOSIS — Z1231 Encounter for screening mammogram for malignant neoplasm of breast: Secondary | ICD-10-CM

## 2020-01-27 ENCOUNTER — Ambulatory Visit
Admission: RE | Admit: 2020-01-27 | Discharge: 2020-01-27 | Disposition: A | Discharge status: Home / Self Care | Payer: Medicare HMO | Source: Ambulatory Visit | Attending: Internal Medicine | Admitting: Internal Medicine

## 2020-01-27 ENCOUNTER — Other Ambulatory Visit: Payer: Self-pay

## 2020-01-27 DIAGNOSIS — Z1231 Encounter for screening mammogram for malignant neoplasm of breast: Secondary | ICD-10-CM

## 2020-04-19 ENCOUNTER — Telehealth: Payer: Self-pay | Admitting: Physical Medicine and Rehabilitation

## 2020-04-19 NOTE — Telephone Encounter (Signed)
Left message #1 to schedule OV. 

## 2020-04-19 NOTE — Telephone Encounter (Signed)
Pt called and needs to be seen for lower back.

## 2020-06-07 ENCOUNTER — Ambulatory Visit
Admission: RE | Admit: 2020-06-07 | Discharge: 2020-06-07 | Disposition: A | Discharge status: Home / Self Care | Payer: Medicare HMO | Source: Ambulatory Visit | Attending: Internal Medicine | Admitting: Internal Medicine

## 2020-06-07 ENCOUNTER — Other Ambulatory Visit: Payer: Self-pay | Admitting: Internal Medicine

## 2020-06-07 ENCOUNTER — Other Ambulatory Visit: Payer: Self-pay

## 2020-06-07 DIAGNOSIS — R0600 Dyspnea, unspecified: Secondary | ICD-10-CM

## 2020-06-21 ENCOUNTER — Telehealth: Payer: Self-pay | Admitting: Physical Medicine and Rehabilitation

## 2020-06-21 NOTE — Telephone Encounter (Signed)
Patient was last seen for OV in 2020. She states that she fell in December and has been hurting since then. She did have x-rays. Scheduled for OV 3/22 at 0815.

## 2020-06-21 NOTE — Telephone Encounter (Signed)
Patient called requesting a appt for back injections. Please call patient to set appt with Dr. Alvester Morin. Patient phone number is [phone].

## 2020-07-10 ENCOUNTER — Ambulatory Visit (INDEPENDENT_AMBULATORY_CARE_PROVIDER_SITE_OTHER): Payer: Medicare HMO

## 2020-07-10 ENCOUNTER — Ambulatory Visit: Payer: Medicare HMO | Admitting: Physical Medicine and Rehabilitation

## 2020-07-10 ENCOUNTER — Other Ambulatory Visit: Payer: Self-pay

## 2020-07-10 ENCOUNTER — Telehealth: Payer: Self-pay | Admitting: Physical Medicine and Rehabilitation

## 2020-07-10 ENCOUNTER — Encounter: Payer: Self-pay | Admitting: Physical Medicine and Rehabilitation

## 2020-07-10 VITALS — BP 138/88 | HR 83

## 2020-07-10 DIAGNOSIS — M47816 Spondylosis without myelopathy or radiculopathy, lumbar region: Secondary | ICD-10-CM | POA: Diagnosis not present

## 2020-07-10 DIAGNOSIS — S39012S Strain of muscle, fascia and tendon of lower back, sequela: Secondary | ICD-10-CM | POA: Diagnosis not present

## 2020-07-10 DIAGNOSIS — Z981 Arthrodesis status: Secondary | ICD-10-CM | POA: Diagnosis not present

## 2020-07-10 DIAGNOSIS — M961 Postlaminectomy syndrome, not elsewhere classified: Secondary | ICD-10-CM

## 2020-07-10 NOTE — Telephone Encounter (Signed)
Approved Authorization #[id number]

## 2020-07-10 NOTE — Progress Notes (Signed)
Fell at Christmas and had pain the next day. Pain with standing in low back. Some pain in left lower leg, but PT is helping.  Numeric Pain Rating Scale and Functional Assessment Average Pain 8 Pain Right Now 6 My pain is constant, dull and aching Pain is worse with: standing Pain improves with: medication   In the last MONTH (on 0-10 scale) has pain interfered with the following?  1. General activity like being  able to carry out your everyday physical activities such as walking, climbing stairs, carrying groceries, or moving a chair?  Rating(0)  2. Relation with others like being able to carry out your usual social activities and roles such as  activities at home, at work and in your community. Rating(0)  3. Enjoyment of life such that you have  been bothered by emotional problems such as feeling anxious, depressed or irritable?  Rating(5)

## 2020-07-10 NOTE — Telephone Encounter (Signed)
Needs auth for bilateral L5-S1 MBB. Scheduled for 3/24.

## 2020-07-10 NOTE — Progress Notes (Signed)
Ariel Fisher - 75 y.o. female MRN [id number]  Date of birth: 05-Apr-1946  Office Visit Note: Visit Date: 07/10/2020 PCP: Andi Devon, MD Referred by: Andi Devon, MD  Subjective: Chief Complaint  Patient presents with  . Lower Back - Pain   HPI: For evaluation and management of chronic worsening severe mostly axial low back pain worse after a fall in December.  She is followed from an orthopedic standpoint by Dr. Georgena Spurling.  She is also followed by Dr. Althea Charon at [facility] orthopedic.  The last time I saw the patient was in October 2020.  We completed bilateral L5 transforaminal injection at that time with good relief of symptoms.  She is status post lumbar instrumented fusion at L4-5 by Dr. Tressie Stalker.  MRI findings in 2017 showed adjacent level facet arthropathy at L5-S1 without stenosis.  She has had chronic left paresthesias in an L5 distribution post surgery.  These paresthesias do not bother her and is something she is learned to live with.  She reports up until December she was doing quite well in terms of her back pain.  She was working on the Christmas tree and basically felt like she had to sit down and when she sat down ever since that time she has had some low back pain worsening with standing and ambulating better at rest.  Walking is okay but standing is difficult.  Extension most worse than flexion.  She was originally seen by Dr. Althea Charon and it was felt to be more of a lumbar sprain strain.  She has been in physical therapy at [facility] orthopedic since that time and it has helped to some degree but not really a great deal.  Since I have seen her she has had a right total knee replacement with Dr. Georgena Spurling.  She reports no other paresthesias down the legs no focal weakness no bowel or bladder changes.  She has had over-the-counter medications with anti-inflammatory muscle relaxers and physical therapy without any relief of her symptoms at this point.  She  reports 8 out of 10 back pain on average which is a constant dull and aching pain.   Review of Systems  Musculoskeletal: Positive for back pain.  Neurological: Positive for tingling.  All other systems reviewed and are negative.  Otherwise per HPI.  Assessment & Plan: Visit Diagnoses:  1. Strain of lumbar region, sequela   2. Spondylosis without myelopathy or radiculopathy, lumbar region   3. Post laminectomy syndrome   4. S/P lumbar spinal fusion     Plan: Findings:  Chronic worsening severe axial low back pain worse over the last 3 months may be precipitated by a ground-level fall without any specific injury to the spine.  X-rays today were unrevealing from a structural standpoint she has a good anatomic alignment post fusion.  She does have some arthritis of the left hip.  No groin pain no pain on rotation.  Pain with facet loading of the lumbar spine.  Failure of conservative care with physical therapy and medications.  At this point I would like to complete diagnostic medial branch blocks of the L5-S1 facet joints below the fusion.  If she does well with that diagnostically with pain diary would look at double block paradigm and possible radiofrequency ablation.  If she does not do well with that would look at a one-time epidural injection versus repeat MRI at some point.  No red flag symptoms to warrant imaging at this point.  She will continue  with other conservative care including physical therapy for now. Current medication management is not beneficial in increasing their functional status.  Procedures are done as part of a comprehensive orthopedic and pain management program with access to in-house orthopedics, spine surgery and physical therapy as well as access to [facility] biopsychosocial counseling if needed.     Meds & Orders: No orders of the defined types were placed in this encounter.   Orders Placed This Encounter  Procedures  . XR Lumbar Spine 2-3 Views     Follow-up: Return for Lumbar medial branch blocks at L5-S1.   Procedures: No procedures performed      Clinical History: Lumbar Spine MRI 01/17/2016 FINDINGS: #Osseous structures: Prior posterior decompression with hardware fixation and interbody spacer at L4-L5. The vertebral body heights and marrow signal are unremarkable. No evidence of an acute fracture. Thoracolumbar anterolateral osteophytes are  appreciated. #Alignment:Alignment maintained. #Conus medullaris/cauda equina: Spinal cord signal and termination are within normal limits.  #Lower thoracic spine: No significant spinal canal or foraminal stenosis. This is viewed on the sagittal images only.  #T12-L1: No significant disc herniation, spinal canal or neuroforaminal compromise. This is viewed on sagittal images only. #L1-L2: No significant disc herniation, spinal canal or neuroforaminal compromise. This is viewed on the sagittal images only. #L2-L3: No significant disc herniation, spinal canal or neural foraminal compromise. #L3-L4: Mild facet hypertrophy on the LEFT. No significant disc herniation, spinal canal or neural foraminal compromise. #L4-L5: Postoperative changes. Spinal canal and foramen are patent. #L5-S1: Facet hypertrophy on the RIGHT. No significant disc herniation. Mild RIGHT neuroforaminal stenosis. The LEFT neuroforamen and spinal canal are patent.  #Paraspinal tissues: Postoperative changes at L4-L5.  Cervcial MRI 01/2016 IMPRESSION: 1.Moderate cervical spondylosis most prominently at C4-C5 and C5-C6 with degenerative facet changes throughout the cervical levels. 2.Multilevel disc bulges, disc osteophytes and small protrusions mildly effaces the anterior thecal sac and mildly to moderately narrow the neural foramina most prominently on the right at C4-C5. Possible mild impingement on the exiting right C5 root.  She reports that she has never smoked. She has never used smokeless  tobacco. No results for input(s): HGBA1C, LABURIC in the last 8760 hours.  Objective:  VS:  HT:    WT:   BMI:     BP:138/88  HR:83bpm  TEMP: ( )  RESP:  Physical Exam Vitals and nursing note reviewed.  Constitutional:      General: She is not in acute distress.    Appearance: Normal appearance. She is obese. She is not ill-appearing.  HENT:     Head: Normocephalic and atraumatic.     Right Ear: External ear normal.     Left Ear: External ear normal.  Eyes:     Extraocular Movements: Extraocular movements intact.  Cardiovascular:     Rate and Rhythm: Normal rate.     Pulses: Normal pulses.  Pulmonary:     Effort: Pulmonary effort is normal. No respiratory distress.  Abdominal:     General: There is no distension.     Palpations: Abdomen is soft.  Musculoskeletal:        General: Tenderness present.     Cervical back: Neck supple.     Right lower leg: No edema.     Left lower leg: No edema.     Comments: Patient has good distal strength with no pain over the greater trochanters.  No clonus or focal weakness. .Patient somewhat slow to rise from a seated position to full extension.  There is concordant low back pain with facet loading and lumbar spine extension rotation.  There are no definitive trigger points but the patient is somewhat tender across the lower back and PSIS.  There is no pain with hip rotation.   Skin:    Findings: No erythema, lesion or rash.  Neurological:     General: No focal deficit present.     Mental Status: She is alert and oriented to person, place, and time.     Sensory: No sensory deficit.     Motor: No weakness or abnormal muscle tone.     Coordination: Coordination normal.  Psychiatric:        Mood and Affect: Mood normal.        Behavior: Behavior normal.     Ortho Exam Imaging: XR Lumbar Spine 2-3 Views  Result Date: 07/10/2020 AP and lateral lumbar spine shows moderate degenerative joint disease in the left hip and mild to moderate in  the right hip.  Lumbar spine with normal anatomic alignment with lumbar instrumented fusion at L4-5.  There is facet arthropathy bilaterally at L5-S1 below the fusion with some foraminal narrowing.  No fractures or dislocations.   Past Medical/Family/Surgical/Social History: Medications & Allergies reviewed per EMR Patient Active Problem List   Diagnosis Date Noted  . 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 Spondylolisthesis of lumbar region 08/08/2013   Past Medical History:  Diagnosis Date  . Anxiety   . Depression   . Hypertension    History reviewed. No pertinent family history. Past Surgical History:  Procedure Laterality Date  . CARDIAC CATHETERIZATION     15 yrs ago ..clean  . CESAREAN SECTION    . TOTAL KNEE ARTHROPLASTY Left 02/14/2019   Procedure: TOTAL KNEE ARTHROPLASTY;  Surgeon: Dannielle Huh, MD;  Location: [facility];  Service: Orthopedics;  Laterality: Left;  75 mins needed for length of case  . TOTAL KNEE ARTHROPLASTY Right 04/11/2019   Procedure: TOTAL KNEE ARTHROPLASTY;  Surgeon: Dannielle Huh, MD;  Location: [facility];  Service: Orthopedics;  Laterality: Right;  SAME DAY DISCHARGE   Social History   Occupational History  . Not on file  Tobacco Use  . Smoking status: Never Smoker  . Smokeless tobacco: Never Used  Vaping Use  . Vaping Use: Never used  Substance and Sexual Activity  . Alcohol use: Yes    Comment: occ  . Drug use: No  . Sexual activity: Not on file

## 2020-07-12 ENCOUNTER — Other Ambulatory Visit: Payer: Self-pay

## 2020-07-12 ENCOUNTER — Encounter: Payer: Self-pay | Admitting: Physical Medicine and Rehabilitation

## 2020-07-12 ENCOUNTER — Ambulatory Visit (INDEPENDENT_AMBULATORY_CARE_PROVIDER_SITE_OTHER): Payer: Medicare HMO | Admitting: Physical Medicine and Rehabilitation

## 2020-07-12 ENCOUNTER — Ambulatory Visit: Payer: Self-pay

## 2020-07-12 VITALS — BP 136/87 | HR 75

## 2020-07-12 DIAGNOSIS — M47816 Spondylosis without myelopathy or radiculopathy, lumbar region: Secondary | ICD-10-CM | POA: Diagnosis not present

## 2020-07-12 MED ORDER — BUPIVACAINE HCL 0.5 % IJ SOLN
3.0000 mL | Freq: Once | INTRAMUSCULAR | Status: AC
  Administered 2020-07-12: 3 mL

## 2020-07-12 NOTE — Patient Instructions (Signed)

## 2020-07-12 NOTE — Progress Notes (Signed)
Pt state lower back pain. Pt state walking for a long period of time makes the pain worse. Pt state she stop to rest and take pain meds to help ease the pain.  Numeric Pain Rating Scale and Functional Assessment Average Pain 6   In the last MONTH (on 0-10 scale) has pain interfered with the following?  1. General activity like being  able to carry out your everyday physical activities such as walking, climbing stairs, carrying groceries, or moving a chair?  Rating(10)   +Driver, -BT, -Dye Allergies.

## 2020-07-13 ENCOUNTER — Ambulatory Visit: Payer: Medicare HMO | Admitting: Cardiology

## 2020-07-13 ENCOUNTER — Encounter: Payer: Self-pay | Admitting: Cardiology

## 2020-07-13 VITALS — BP 152/93 | HR 78 | Temp 98.6°F | Resp 17 | Ht 67.0 in | Wt 275.4 lb

## 2020-07-13 DIAGNOSIS — I10 Essential (primary) hypertension: Secondary | ICD-10-CM

## 2020-07-13 DIAGNOSIS — R9431 Abnormal electrocardiogram [ECG] [EKG]: Secondary | ICD-10-CM

## 2020-07-13 DIAGNOSIS — R0600 Dyspnea, unspecified: Secondary | ICD-10-CM

## 2020-07-13 DIAGNOSIS — R0609 Other forms of dyspnea: Secondary | ICD-10-CM

## 2020-07-13 MED ORDER — AMLODIPINE BESYLATE 5 MG PO TABS: 5.0000 mg | ORAL_TABLET | Freq: Every day | ORAL | 0 refills | Status: DC

## 2020-07-13 NOTE — Progress Notes (Signed)
Ariel Fisher - 75 y.o. female MRN [id number]  Date of birth: September 29, 1945  Office Visit Note: Visit Date: 07/12/2020 PCP: Andi Devon, MD Referred by: Andi Devon, MD  Subjective: Chief Complaint  Patient presents with  . Lower Back - Pain   HPI:  Ariel Fisher is a 75 y.o. female who comes in today for planned Bilateral  L5-S1 Lumbar facet/medial branch block with fluoroscopic guidance.  The patient has failed conservative care including home exercise, medications, time and activity modification.  This injection will be diagnostic and hopefully therapeutic.  Please see requesting physician notes for further details and justification.  Exam has shown concordant pain with facet joint loading.   ROS Otherwise per HPI.  Assessment & Plan: Visit Diagnoses:    ICD-10-CM   1. Spondylosis without myelopathy or radiculopathy, lumbar region  M47.816 XR C-ARM NO REPORT    Facet Injection    bupivacaine (MARCAINE) 0.5 % (with pres) injection 3 mL    Plan: No additional findings.   Meds & Orders:  Meds ordered this encounter  Medications  . bupivacaine (MARCAINE) 0.5 % (with pres) injection 3 mL    Orders Placed This Encounter  Procedures  . Facet Injection  . XR C-ARM NO REPORT    Follow-up: Return for Review Pain Diary.   Procedures: No procedures performed  Lumbar Diagnostic Facet Joint Nerve Block with Fluoroscopic Guidance   Patient: Ariel Fisher      Date of Birth: Jan 30, 1946 MRN: [id number] PCP: Andi Devon, MD      Visit Date: 07/12/2020   Universal Protocol:    Date/Time: 03/25/225:33 AM  Consent Given By: the patient  Position: PRONE  Additional Comments: Vital signs were monitored before and after the procedure. Patient was prepped and draped in the usual sterile fashion. The correct patient, procedure, and site was verified.   Injection Procedure Details:   Procedure diagnoses:  1. Spondylosis without myelopathy or  radiculopathy, lumbar region      Meds Administered:  Meds ordered this encounter  Medications  . bupivacaine (MARCAINE) 0.5 % (with pres) injection 3 mL     Laterality: Bilateral  Location/Site: L5-S1, L4 medial branch and L5 dorsal ramus  Needle: 5.0 in., 25 ga.  Short bevel or Quincke spinal needle  Needle Placement: Oblique pedical  Findings:   -Comments: There was excellent flow of contrast along the articular pillars without intravascular flow.  Procedure Details: The fluoroscope beam is vertically oriented in AP and then obliqued 15 to 20 degrees to the ipsilateral side of the desired nerve to achieve the "Scotty dog" appearance.  The skin over the target area of the junction of the superior articulating process and the transverse process (sacral ala if blocking the L5 dorsal rami) was locally anesthetized with a 1 ml volume of 1% Lidocaine without Epinephrine.  The spinal needle was inserted and advanced in a trajectory view down to the target.   After contact with periosteum and negative aspirate for blood and CSF, correct placement without intravascular or epidural spread was confirmed by injecting 0.5 ml. of Isovue-250.  A spot radiograph was obtained of this image.    Next, a 0.5 ml. volume of the injectate described above was injected. The needle was then redirected to the other facet joint nerves mentioned above if needed.  Prior to the procedure, the patient was given a Pain Diary which was completed for baseline measurements.  After the procedure, the patient rated their pain every 30 minutes  and will continue rating at this frequency for a total of 5 hours.  The patient has been asked to complete the Diary and return to [place] by mail, fax or hand delivered as soon as possible.   Additional Comments:  The patient tolerated the procedure well Dressing: 2 x 2 sterile gauze and Band-Aid    Post-procedure details: Patient was observed during the  procedure. Post-procedure instructions were reviewed.  Patient left the clinic in stable condition.     Clinical History: Lumbar Spine MRI 01/17/2016 FINDINGS: #Osseous structures: Prior posterior decompression with hardware fixation and interbody spacer at L4-L5. The vertebral body heights and marrow signal are unremarkable. No evidence of an acute fracture. Thoracolumbar anterolateral osteophytes are  appreciated. #Alignment:Alignment maintained. #Conus medullaris/cauda equina: Spinal cord signal and termination are within normal limits.  #Lower thoracic spine: No significant spinal canal or foraminal stenosis. This is viewed on the sagittal images only.  #T12-L1: No significant disc herniation, spinal canal or neuroforaminal compromise. This is viewed on sagittal images only. #L1-L2: No significant disc herniation, spinal canal or neuroforaminal compromise. This is viewed on the sagittal images only. #L2-L3: No significant disc herniation, spinal canal or neural foraminal compromise. #L3-L4: Mild facet hypertrophy on the LEFT. No significant disc herniation, spinal canal or neural foraminal compromise. #L4-L5: Postoperative changes. Spinal canal and foramen are patent. #L5-S1: Facet hypertrophy on the RIGHT. No significant disc herniation. Mild RIGHT neuroforaminal stenosis. The LEFT neuroforamen and spinal canal are patent.  #Paraspinal tissues: Postoperative changes at L4-L5.  Cervcial MRI 01/2016 IMPRESSION: 1.Moderate cervical spondylosis most prominently at C4-C5 and C5-C6 with degenerative facet changes throughout the cervical levels. 2.Multilevel disc bulges, disc osteophytes and small protrusions mildly effaces the anterior thecal sac and mildly to moderately narrow the neural foramina most prominently on the right at C4-C5. Possible mild impingement on the exiting right C5 root.     Objective:  VS:  HT:    WT:   BMI:     BP:136/87  HR:75bpm   TEMP: ( )  RESP:  Physical Exam Vitals and nursing note reviewed.  Constitutional:      General: She is not in acute distress.    Appearance: Normal appearance. She is obese. She is not ill-appearing.  HENT:     Head: Normocephalic and atraumatic.     Right Ear: External ear normal.     Left Ear: External ear normal.  Eyes:     Extraocular Movements: Extraocular movements intact.  Cardiovascular:     Rate and Rhythm: Normal rate.     Pulses: Normal pulses.  Pulmonary:     Effort: Pulmonary effort is normal. No respiratory distress.  Abdominal:     General: There is no distension.     Palpations: Abdomen is soft.  Musculoskeletal:        General: Tenderness present.     Cervical back: Neck supple.     Right lower leg: No edema.     Left lower leg: No edema.     Comments: Patient has good distal strength with no pain over the greater trochanters.  No clonus or focal weakness. Patient somewhat slow to rise from a seated position to full extension.  There is concordant low back pain with facet loading and lumbar spine extension rotation.  There are no definitive trigger points but the patient is somewhat tender across the lower back and PSIS.  There is no pain with hip rotation.   Skin:    Findings: No erythema, lesion  or rash.  Neurological:     General: No focal deficit present.     Mental Status: She is alert and oriented to person, place, and time.     Sensory: No sensory deficit.     Motor: No weakness or abnormal muscle tone.     Coordination: Coordination normal.  Psychiatric:        Mood and Affect: Mood normal.        Behavior: Behavior normal.      Imaging: XR C-ARM NO REPORT  Result Date: 07/12/2020 Please see Notes tab for imaging impression.

## 2020-07-13 NOTE — Procedures (Signed)
Lumbar Diagnostic Facet Joint Nerve Block with Fluoroscopic Guidance   Patient: Ariel Fisher      Date of Birth: [birth year] MRN: [id number] PCP: Andi Devon, MD      Visit Date: 07/12/2020   Universal Protocol:    Date/Time: 03/25/225:33 AM  Consent Given By: the patient  Position: PRONE  Additional Comments: Vital signs were monitored before and after the procedure. Patient was prepped and draped in the usual sterile fashion. The correct patient, procedure, and site was verified.   Injection Procedure Details:   Procedure diagnoses:  1. Spondylosis without myelopathy or radiculopathy, lumbar region      Meds Administered:  Meds ordered this encounter  Medications  . bupivacaine (MARCAINE) 0.5 % (with pres) injection 3 mL     Laterality: Bilateral  Location/Site: L5-S1, L4 medial branch and L5 dorsal ramus  Needle: 5.0 in., 25 ga.  Short bevel or Quincke spinal needle  Needle Placement: Oblique pedical  Findings:   -Comments: There was excellent flow of contrast along the articular pillars without intravascular flow.  Procedure Details: The fluoroscope beam is vertically oriented in AP and then obliqued 15 to 20 degrees to the ipsilateral side of the desired nerve to achieve the "Scotty dog" appearance.  The skin over the target area of the junction of the superior articulating process and the transverse process (sacral ala if blocking the L5 dorsal rami) was locally anesthetized with a 1 ml volume of 1% Lidocaine without Epinephrine.  The spinal needle was inserted and advanced in a trajectory view down to the target.   After contact with periosteum and negative aspirate for blood and CSF, correct placement without intravascular or epidural spread was confirmed by injecting 0.5 ml. of Isovue-250.  A spot radiograph was obtained of this image.    Next, a 0.5 ml. volume of the injectate described above was injected. The needle was then redirected to the  other facet joint nerves mentioned above if needed.  Prior to the procedure, the patient was given a Pain Diary which was completed for baseline measurements.  After the procedure, the patient rated their pain every 30 minutes and will continue rating at this frequency for a total of 5 hours.  The patient has been asked to complete the Diary and return to [place] by mail, fax or hand delivered as soon as possible.   Additional Comments:  The patient tolerated the procedure well Dressing: 2 x 2 sterile gauze and Band-Aid    Post-procedure details: Patient was observed during the procedure. Post-procedure instructions were reviewed.  Patient left the clinic in stable condition.

## 2020-07-13 NOTE — Progress Notes (Signed)
Primary Physician/Referring:  Willey Blade, MD  Patient ID: Ariel Fisher, female    DOB: [birth year], 75 y.o.   MRN: [id number]  Chief Complaint  Patient presents with  . Shortness of Breath    Ref by Willey Blade MD   HPI:    Ariel Fisher  is a 75 y.o. African-American female with hypertension, degenerative spine disease, degenerative joint disease, referred to me for evaluation of dyspnea on exertion and abnormal EKG.  Patient has chronic dyspnea on exertion but denies PND or orthopnea or chest pain with exertion activity but does admit to not being physically active.  She has never had any cardiac evaluation.  Past Medical History:  Diagnosis Date  . Anxiety   . Depression   . Hypertension    Past Surgical History:  Procedure Laterality Date  . BILATERAL CARPAL TUNNEL RELEASE    . CARDIAC CATHETERIZATION     15 yrs ago ..clean  . CESAREAN SECTION    . 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 TOTAL KNEE ARTHROPLASTY Right 04/11/2019   Procedure: TOTAL KNEE ARTHROPLASTY;  Surgeon: Vickey Huger, MD;  Location: [facility];  Service: Orthopedics;  Laterality: Right;  SAME DAY DISCHARGE   Family History  Problem Relation Age of Onset  . Heart disease Mother   . Liver disease Father   . Non-Hodgkin's lymphoma Sister   . Liver disease Sister     Social History   Tobacco Use  . Smoking status: Never Smoker  . Smokeless tobacco: Never Used  Substance Use Topics  . Alcohol use: Yes    Alcohol/week: 1.0 standard drink    Types: 1 Glasses of wine per week    Comment: occ   Marital Status: Married  ROS  Review of Systems  Cardiovascular: Positive for dyspnea on exertion. Negative for chest pain and leg swelling.  Musculoskeletal: Positive for arthritis and back pain.  Gastrointestinal:  Negative for melena.   Objective  Blood pressure (!) 152/93, pulse 78, temperature 98.6 F (37 C), temperature source Temporal, resp. rate 17, height '5\' 7"'  (1.702 m), weight 275 lb 6.4 oz (124.9 kg), SpO2 97 %.  Vitals with BMI 07/13/2020 07/13/2020 07/12/2020  Height - '5\' 7"'  -  Weight - 275 lbs 6 oz -  BMI - 08.67 -  Systolic 619 509 326  Diastolic 93 89 87  Pulse 78 85 75     Physical Exam Constitutional:      Comments: Morbidly obese in no acute distress.  Cardiovascular:     Rate and Rhythm: Normal rate and regular rhythm.     Pulses:          Carotid pulses are 2+ on the right side and 2+ on the left side.      Dorsalis pedis pulses are 2+ on the right side and 2+ on the left side.       Posterior tibial pulses are 2+ on the right side and 2+ on the left side.     Heart sounds: Normal heart sounds. No murmur heard. No gallop.      Comments: Femoral and popliteal pulse difficult to feel due to patient's body habitus.  No leg edema. No JVD. Pulmonary:     Effort: Pulmonary effort is normal.     Breath sounds: Normal breath sounds.  Abdominal:  General: Bowel sounds are normal.     Palpations: Abdomen is soft.     Comments: Obese. Pannus present    Laboratory examination:   No results for input(s): NA, K, CL, CO2, GLUCOSE, BUN, CREATININE, CALCIUM, GFRNONAA, GFRAA in the last 8760 hours. CrCl cannot be calculated (Patient's most recent lab result is older than the maximum 21 days allowed.).  CMP Latest Ref Rng & Units 04/06/2019 02/15/2019 02/07/2019  Glucose 70 - 99 mg/dL 100(H) 119(H) 89  BUN 8 - 23 mg/dL '12 17 12  ' Creatinine 0.44 - 1.00 mg/dL 0.64 0.72 0.79  Sodium 135 - 145 mmol/L 142 139 142  Potassium 3.5 - 5.1 mmol/L 3.7 4.0 4.1  Chloride 98 - 111 mmol/L 108 109 107  CO2 22 - 32 mmol/L '25 22 26  ' Calcium 8.9 - 10.3 mg/dL 9.2 8.6(L) 9.0  Total Protein 6.5 - 8.1 g/dL 7.1 - 7.2  Total Bilirubin 0.3 - 1.2 mg/dL 0.6 - 0.8  Alkaline Phos 38 - 126 U/L 58 - 57  AST  15 - 41 U/L 22 - 22  ALT 0 - 44 U/L 17 - 18   CBC Latest Ref Rng & Units 04/06/2019 02/15/2019 02/07/2019  WBC 4.0 - 10.5 K/uL 7.4 16.3(H) 6.4  Hemoglobin 12.0 - 15.0 g/dL 12.8 11.5(L) 12.5  Hematocrit 36.0 - 46.0 % 41.1 37.7 39.8  Platelets 150 - 400 K/uL 454(H) 403(H) 409(H)    Lipid Panel No results for input(s): CHOL, TRIG, LDLCALC, VLDL, HDL, CHOLHDL, LDLDIRECT in the last 8760 hours. Lipid Panel  No results found for: CHOL, TRIG, HDL, CHOLHDL, VLDL, LDLCALC, LDLDIRECT, LABVLDL   HEMOGLOBIN A1C No results found for: HGBA1C, MPG TSH No results for input(s): TSH in the last 8760 hours.  External labs:   Lab 02/70/2022:  C-reactive protein 6.65.  Vitamin D 44.9.  TSH normal.  A1c 4.6%.  Hb 13.6/HCT 41.7, platelets 188, mildly elevated.  Normal indicis.  Serum glucose 84 mg, BUN 10, creatinine 0.81, EGFR 83 mL, potassium 4.3, CMP otherwise normal.  Total cholesterol 180, triglycerides 91, HDL 31, LDL 115.  LDL particle #1560.  LP-IR score 52, mildly elevated.  Medications and allergies   Allergies  Allergen Reactions  . Tramadol Other (See Comments)    Hives, nausea, dizziness     Outpatient Medications Prior to Visit  Medication Sig Note  . acetaminophen (TYLENOL) 500 MG tablet Take 500 mg by mouth every 6 (six) hours as needed (pain).   Marland Kitchen ALPRAZolam (XANAX) 0.25 MG tablet Take 0.25 mg by mouth at bedtime as needed for sleep.    Marland Kitchen gabapentin (NEURONTIN) 600 MG tablet Take 600 mg by mouth at bedtime as needed (pain).    . meloxicam (MOBIC) 15 MG tablet Take by mouth.   . Omega-3 Fatty Acids (FISH OIL) 500 MG CAPS Take 500 mg by mouth daily.    . SYMBICORT 80-4.5 MCG/ACT inhaler Inhale into the lungs.   . [DISCONTINUED] amLODipine (NORVASC) 2.5 MG tablet Take 2.5 mg by mouth at bedtime.  07/13/2020: Increase to 5 mg  . [DISCONTINUED] BESIVANCE 0.6 % SUSP    . [DISCONTINUED] LOTEMAX 0.5 % ophthalmic suspension 1 drop 4 (four) times daily.   . [DISCONTINUED]  methocarbamol (ROBAXIN) 500 MG tablet Take 1-2 tablets (500-1,000 mg total) by mouth every 6 (six) hours as needed for muscle spasms.   . [DISCONTINUED] OVER THE COUNTER MEDICATION Take 1 tablet by mouth daily. Keto supplement   . [DISCONTINUED] PROLENSA 0.07 % SOLN Place 1 drop  into the left eye daily.    No facility-administered medications prior to visit.    Radiology:   Chest x-ray PA lateral view 06/07/2020: Cardiomediastinal silhouette unchanged in size and contour. No pneumothorax. No pleural effusion. No confluent airspace disease. Degenerative changes of the spine. No acute displaced fracture.  X-ray lumbar spine 07/10/2020: AP and lateral lumbar spine shows moderate degenerative joint disease in  the left hip and mild to moderate in the right hip. Lumbar spine with  normal anatomic alignment with lumbar instrumented fusion at L4-5. There  is facet arthropathy bilaterally at L5-S1 below the fusion with some  foraminal narrowing. No fractures or dislocations.  XR C-ARM NO REPORT  Result Date: 07/12/2020 Please see Notes tab for imaging impression.    Cardiac Studies:   None EKG:     EKG 07/13/2020: Normal sinus rhythm at rate of 77 bpm, left axis deviation, left intrafascicular block.  LVH.  T wave abnormality, inferior and anterolateral ischemia. No significant change from EKG 06/07/2020.   Compared to 04/11/2019, Mobitz 1 AV block not present and T wave abnormality much more pronounced suggestive of inferior and anterolateral ischemia.  Assessment     ICD-10-CM   1. Dyspnea on exertion  R06.00 EKG 12-Lead    PCV ECHOCARDIOGRAM COMPLETE    PCV MYOCARDIAL PERFUSION WITH LEXISCAN    CT CARDIAC SCORING (SELF PAY ONLY)  2. Abnormal EKG  R94.31 PCV MYOCARDIAL PERFUSION WITH LEXISCAN    CT CARDIAC SCORING (SELF PAY ONLY)  3. Primary hypertension  I10 amLODipine (NORVASC) 5 MG tablet     Medications Discontinued During This Encounter  Medication Reason  .  BESIVANCE 0.6 % SUSP Error  . LOTEMAX 0.5 % ophthalmic suspension Error  . OVER THE COUNTER MEDICATION Error  . PROLENSA 0.07 % SOLN Error  . methocarbamol (ROBAXIN) 500 MG tablet Error  . amLODipine (NORVASC) 2.5 MG tablet Dose change    Meds ordered this encounter  Medications  . amLODipine (NORVASC) 5 MG tablet    Sig: Take 1 tablet (5 mg total) by mouth daily.    Dispense:  90 tablet    Refill:  0   Orders Placed This Encounter  Procedures  . CT CARDIAC SCORING (SELF PAY ONLY)    $99 @ TOS NO Caffeine 24 hrs prior/No exercise 6 hrs prior   WT 275 No Special Needs/No spinal cord stimulator/body injector/glucose monitor/port attached) No COVID [place] with PT Please remember if you need to cancel your appt, please do so 24 hrs prior to your appt to avoid getting charged a no-show fee of $75.00     Standing Status:   Future    Standing Expiration Date:   09/12/2020    Order Specific Question:   Preferred imaging location?    Answer:   [facility]    Order Specific Question:   Radiology Contrast Protocol - do NOT remove file path    Answer:   [email or web address]  . PCV MYOCARDIAL PERFUSION WITH LEXISCAN    Standing Status:   Future    Standing Expiration Date:   09/12/2020  . EKG 12-Lead  . PCV ECHOCARDIOGRAM COMPLETE    Standing Status:   Future    Standing Expiration Date:   07/13/2021   Recommendations:   Ariel Fisher is a 75 y.o. African-American female with hypertension, degenerative spine disease, degenerative joint disease, referred to me for evaluation of dyspnea on exertion and abnormal EKG.  I reviewed her chart, during recovery of total  knee replacement in 2020, an EKG that was obtained had revealed markedly abnormal T wave inversion which appears to persist now but much improved and she also had Mobitz 1 AV block.  Dyspnea on exertion may be anginal equivalent with abnormal EKG and she has never had any cardiac  evaluation.  Schedule for a Lexiscan Nuclear stress test to evaluate for myocardial ischemia. Patient unable to do treadmill stress testing due to back pain and prior surgery. Will schedule for an echocardiogram and coronary calcium score and if elevated will need therapy with statin (mildly elevated LDL, but moderately elevated LDL particle number), Increase Amlodipine to 5 mg for hypertension and consider ACEi addition with HCT. Office visit following the work-up/investigations.   Discussed obesity related cardiovascular risks.    Adrian Prows, MD, [facility] 07/14/2020, 7:26 AM Office: [phone]

## 2020-07-23 ENCOUNTER — Other Ambulatory Visit: Payer: Self-pay | Admitting: Cardiology

## 2020-07-23 DIAGNOSIS — I10 Essential (primary) hypertension: Secondary | ICD-10-CM

## 2020-07-25 ENCOUNTER — Other Ambulatory Visit: Payer: Self-pay

## 2020-07-25 ENCOUNTER — Ambulatory Visit: Payer: Medicare HMO

## 2020-07-25 DIAGNOSIS — R0609 Other forms of dyspnea: Secondary | ICD-10-CM

## 2020-07-25 DIAGNOSIS — R9431 Abnormal electrocardiogram [ECG] [EKG]: Secondary | ICD-10-CM

## 2020-07-25 DIAGNOSIS — R0600 Dyspnea, unspecified: Secondary | ICD-10-CM

## 2020-07-26 ENCOUNTER — Ambulatory Visit: Payer: Medicare HMO

## 2020-07-26 DIAGNOSIS — R0609 Other forms of dyspnea: Secondary | ICD-10-CM

## 2020-07-26 DIAGNOSIS — R0600 Dyspnea, unspecified: Secondary | ICD-10-CM

## 2020-08-01 ENCOUNTER — Ambulatory Visit
Admission: RE | Admit: 2020-08-01 | Discharge: 2020-08-01 | Disposition: A | Discharge status: Home / Self Care | Payer: No Typology Code available for payment source | Source: Ambulatory Visit | Attending: Cardiology | Admitting: Cardiology

## 2020-08-01 DIAGNOSIS — R0600 Dyspnea, unspecified: Secondary | ICD-10-CM

## 2020-08-01 DIAGNOSIS — R9431 Abnormal electrocardiogram [ECG] [EKG]: Secondary | ICD-10-CM

## 2020-08-01 DIAGNOSIS — R0609 Other forms of dyspnea: Secondary | ICD-10-CM

## 2020-08-01 NOTE — Progress Notes (Signed)
Henry calcium score 08/01/2020:  Total calcium score of 0.  MeSA database percentile 0. Aortic measurement: Ascending aorta 33 mm in descending aorta 27 mm.  No significant extracardiac abnormalities visualized.

## 2020-08-28 ENCOUNTER — Encounter: Payer: Self-pay | Admitting: Cardiology

## 2020-08-28 ENCOUNTER — Ambulatory Visit: Payer: Medicare HMO | Admitting: Cardiology

## 2020-08-28 ENCOUNTER — Other Ambulatory Visit: Payer: Self-pay

## 2020-08-28 VITALS — BP 138/85 | HR 87 | Temp 98.3°F | Resp 17 | Ht 67.0 in | Wt 224.6 lb

## 2020-08-28 DIAGNOSIS — R0600 Dyspnea, unspecified: Secondary | ICD-10-CM

## 2020-08-28 DIAGNOSIS — I10 Essential (primary) hypertension: Secondary | ICD-10-CM

## 2020-08-28 DIAGNOSIS — R9431 Abnormal electrocardiogram [ECG] [EKG]: Secondary | ICD-10-CM

## 2020-08-28 DIAGNOSIS — R0609 Other forms of dyspnea: Secondary | ICD-10-CM

## 2020-08-28 NOTE — Patient Instructions (Signed)

## 2020-08-28 NOTE — Progress Notes (Signed)
Primary Physician/Referring:  Willey Blade, MD  Patient ID: Ariel Fisher, female    DOB: [birth year], 75 y.o.   MRN: [id number]  Chief Complaint  Patient presents with  . Shortness of Breath  . Hypertension  . Follow-up    6 WEEK   HPI:    Ariel Fisher  is a 75 y.o. African-American female with hypertension, degenerative spine disease, degenerative joint disease.  Patient was originally referred to our office for evaluation of dyspnea on exertion and abnormal EKG.  Review of patient's chart had revealed EKG with markedly abnormal T wave inversions and Mobitz 1 AV block in 2020.  T wave inversions have persisted in view of dyspnea on exertion patient underwent cardiac evaluation including coronary calcium score, stress test, and echocardiogram.  Patient now presents for follow-up.  Patient reports since last visit dyspnea on exertion has improved mildly.  Denies chest pain, palpitations, syncope, near syncope, dizziness, leg swelling, orthopnea.  She is tolerating amlodipine 5 mg daily without issue.  She been trying to make diet and lifestyle modifications, however her activity is still limited due to chronic back pain.  She notes that home blood pressure readings are well controlled, and that she is having significant back pain today which may be contributing to elevated blood pressure in our office at this time.  Past Medical History:  Diagnosis Date  . Anxiety   . Depression   . Hypertension    Past Surgical History:  Procedure Laterality Date  . BILATERAL CARPAL TUNNEL RELEASE    . CARDIAC CATHETERIZATION     15 yrs ago ..clean  . CESAREAN SECTION    . 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 TOTAL KNEE ARTHROPLASTY Right 04/11/2019   Procedure: TOTAL KNEE ARTHROPLASTY;  Surgeon: Vickey Huger, MD;   Location: [facility];  Service: Orthopedics;  Laterality: Right;  SAME DAY DISCHARGE   Family History  Problem Relation Age of Onset  . Heart disease Mother   . Liver disease Father   . Non-Hodgkin's lymphoma Sister   . Liver disease Sister     Social History   Tobacco Use  . Smoking status: Never Smoker  . Smokeless tobacco: Never Used  Substance Use Topics  . Alcohol use: Yes    Alcohol/week: 1.0 standard drink    Types: 1 Glasses of wine per week    Comment: occ   Marital Status: Married  ROS  Review of Systems  Cardiovascular: Positive for dyspnea on exertion. Negative for chest pain and leg swelling.  Musculoskeletal: Positive for arthritis and back pain.  Gastrointestinal: Negative for melena.   Objective  Blood pressure 138/85, pulse 87, temperature 98.3 F (36.8 C), temperature source Temporal, resp. rate 17, height '5\' 7"'  (1.702 m), weight 224 lb 9.6 oz (101.9 kg), SpO2 97 %.  Vitals with BMI 08/28/2020 07/13/2020 07/13/2020  Height '5\' 7"'  - '5\' 7"'   Weight 224 lbs 10 oz - 275 lbs 6 oz  BMI 96.78 - 93.81  Systolic 017 510 258  Diastolic 85 93 89  Pulse 87 78 85     Physical Exam Constitutional:      Comments: Morbidly obese in no acute distress.  Cardiovascular:     Rate and Rhythm: Normal rate and regular rhythm.     Pulses:  Carotid pulses are 2+ on the right side and 2+ on the left side.      Dorsalis pedis pulses are 2+ on the right side and 2+ on the left side.       Posterior tibial pulses are 2+ on the right side and 2+ on the left side.     Heart sounds: Normal heart sounds. No murmur heard. No gallop.      Comments: Femoral and popliteal pulse difficult to feel due to patient's body habitus.  No leg edema. No JVD. Pulmonary:     Effort: Pulmonary effort is normal.     Breath sounds: Normal breath sounds.  Abdominal:     General: Bowel sounds are normal.     Palpations: Abdomen is soft.     Comments: Obese. Pannus present  Musculoskeletal:      Right lower leg: No edema.     Left lower leg: No edema.  Skin:    General: Skin is warm and dry.    Laboratory examination:   No results for input(s): NA, K, CL, CO2, GLUCOSE, BUN, CREATININE, CALCIUM, GFRNONAA, GFRAA in the last 8760 hours. CrCl cannot be calculated (Patient's most recent lab result is older than the maximum 21 days allowed.).  CMP Latest Ref Rng & Units 04/06/2019 02/15/2019 02/07/2019  Glucose 70 - 99 mg/dL 100(H) 119(H) 89  BUN 8 - 23 mg/dL '12 17 12  ' Creatinine 0.44 - 1.00 mg/dL 0.64 0.72 0.79  Sodium 135 - 145 mmol/L 142 139 142  Potassium 3.5 - 5.1 mmol/L 3.7 4.0 4.1  Chloride 98 - 111 mmol/L 108 109 107  CO2 22 - 32 mmol/L '25 22 26  ' Calcium 8.9 - 10.3 mg/dL 9.2 8.6(L) 9.0  Total Protein 6.5 - 8.1 g/dL 7.1 - 7.2  Total Bilirubin 0.3 - 1.2 mg/dL 0.6 - 0.8  Alkaline Phos 38 - 126 U/L 58 - 57  AST 15 - 41 U/L 22 - 22  ALT 0 - 44 U/L 17 - 18   CBC Latest Ref Rng & Units 04/06/2019 02/15/2019 02/07/2019  WBC 4.0 - 10.5 K/uL 7.4 16.3(H) 6.4  Hemoglobin 12.0 - 15.0 g/dL 12.8 11.5(L) 12.5  Hematocrit 36.0 - 46.0 % 41.1 37.7 39.8  Platelets 150 - 400 K/uL 454(H) 403(H) 409(H)    Lipid Panel No results for input(s): CHOL, TRIG, LDLCALC, VLDL, HDL, CHOLHDL, LDLDIRECT in the last 8760 hours. Lipid Panel  No results found for: CHOL, TRIG, HDL, CHOLHDL, VLDL, LDLCALC, LDLDIRECT, LABVLDL   HEMOGLOBIN A1C No results found for: HGBA1C, MPG TSH No results for input(s): TSH in the last 8760 hours.  External labs:   02/70/2022: C-reactive protein 6.65. Vitamin D 44.9. TSH normal.  A1c 4.6%. Hb 13.6/HCT 41.7, platelets 188, mildly elevated.  Normal indicis. Serum glucose 84 mg, BUN 10, creatinine 0.81, EGFR 83 mL, potassium 4.3, CMP otherwise normal. Total cholesterol 180, triglycerides 91, HDL 31, LDL 115.  LDL particle #[id number].  LP-IR score 52, mildly elevated.  Medications and allergies   Allergies  Allergen Reactions  . Tramadol Other (See Comments)     Hives, nausea, dizziness     Outpatient Medications Prior to Visit  Medication Sig  . acetaminophen (TYLENOL) 500 MG tablet Take 500 mg by mouth every 6 (six) hours as needed (pain).  Marland Kitchen ALPRAZolam (XANAX) 0.25 MG tablet Take 0.25 mg by mouth at bedtime as needed for sleep.   Marland Kitchen amLODipine (NORVASC) 5 MG tablet TAKE 1 TABLET EVERY DAY  . gabapentin (NEURONTIN) 600  MG tablet Take 600 mg by mouth at bedtime as needed (pain).   . meloxicam (MOBIC) 15 MG tablet Take by mouth.  . Omega-3 Fatty Acids (FISH OIL) 500 MG CAPS Take 500 mg by mouth daily.   . SYMBICORT 80-4.5 MCG/ACT inhaler Inhale into the lungs.   No facility-administered medications prior to visit.    Radiology:   Chest x-ray PA lateral view 06/07/2020: Cardiomediastinal silhouette unchanged in size and contour. No pneumothorax. No pleural effusion. No confluent airspace disease. Degenerative changes of the spine. No acute displaced fracture.  X-ray lumbar spine 07/10/2020: AP and lateral lumbar spine shows moderate degenerative joint disease in  the left hip and mild to moderate in the right hip. Lumbar spine with  normal anatomic alignment with lumbar instrumented fusion at L4-5. There  is facet arthropathy bilaterally at L5-S1 below the fusion with some  foraminal narrowing. No fractures or dislocations.  No results found.   Cardiac Studies:   Echocardiogram 07/26/2020: Left ventricle cavity is normal in size and wall thickness. Normal global wall motion. Normal LV systolic function with EF 67%. Doppler evidence of grade I (impaired) diastolic dysfunction, normal LAP.  Mild tricuspid regurgitation.  No evidence of pulmonary hypertension. On study in 2018,, left atrium was mildly dilated normal size now.   Lexiscan Tetrofosmin stress test 07/25/2020: Lexiscan nuclear stress test performed using 1-day protocol. Stress LVEF 54%. SPECT images show decreased tracer uptake in entire inferior/inferoseptal myocardium on rest  and stress images. While this is likely due to breast tissue attenuation with imaging performed in sitting position, ischemia in this region cannot be excluded. Recommend clinical correlation.  Low risk study.   Coronary calcium score 08/01/2020: Total calcium score of 0. MeSA database percentile 0. Aortic measurement: Ascending aorta 33 mm in descending aorta 27 mm. No significant extracardiac abnormalities visualized.  EKG:   EKG 07/13/2020: Normal sinus rhythm at rate of 77 bpm, left axis deviation, left intrafascicular block.  LVH.  T wave abnormality, inferior and anterolateral ischemia. No significant change from EKG 06/07/2020.   Compared to 04/11/2019, Mobitz 1 AV block not present and T wave abnormality much more pronounced suggestive of inferior and anterolateral ischemia.  Assessment     ICD-10-CM   1. Dyspnea on exertion  R06.00   2. Primary hypertension  I10   3. Abnormal EKG  R94.31      There are no discontinued medications.  No orders of the defined types were placed in this encounter.  No orders of the defined types were placed in this encounter.  Recommendations:   Albertina Bailea Beed is a 75 y.o. African-American female with hypertension, degenerative spine disease, degenerative joint disease.  Patient was originally referred to our office for evaluation of dyspnea on exertion and abnormal EKG.  Review of patient's chart had revealed EKG with markedly abnormal T wave inversions and Mobitz 1 AV block in 2020.  T wave inversions have persisted in view of dyspnea on exertion patient underwent cardiac evaluation including coronary calcium score, stress test, and echocardiogram.  Patient now presents for follow-up.  Reviewed and discussed with patient regarding results of stress test, echocardiogram, and coronary calcium score, details above.  Echocardiogram was essentially normal and stress test was low risk.  Patient had coronary calcium score of 0.  In view of negative  cardiovascular work-up do not suspect dyspnea on exertion to be related to underlying cardiovascular etiology.  Also in view of cardiac work-up do not suspect T wave inversions on EKG  to be due to underlying ischemia or injury.  I have reassured patient.  In regard to hypertension, patient's blood pressure is mildly elevated, however she reports she is in severe back pain today and blood pressure readings are well controlled.  Will not make changes to her medications at this time.  Will defer further management of hypertension to PCP.  Also had previously discussed with patient regarding statin therapy, however coronary calcium score is 0 therefore do not recommend initiation of statin therapy at this time.  Discussed with patient regarding importance of diet and lifestyle modifications as well as weight loss in order to reduce cardiovascular risk and improve blood pressure control, patient verbalized understanding agreement.  Patient is otherwise stable from a cardiovascular standpoint.  Follow-up as needed.   Alethia Berthold, PA-C 08/28/2020, 5:14 PM Office: [phone]

## 2020-10-26 LAB — COLOGUARD: COLOGUARD: NEGATIVE

## 2020-11-14 ENCOUNTER — Other Ambulatory Visit: Payer: Self-pay | Admitting: Cardiology

## 2020-11-14 DIAGNOSIS — I10 Essential (primary) hypertension: Secondary | ICD-10-CM

## 2020-11-23 ENCOUNTER — Telehealth: Payer: Self-pay | Admitting: Physical Medicine and Rehabilitation

## 2020-11-23 NOTE — Telephone Encounter (Signed)
Patient called needing an appointment for her back with Dr Alvester Morin      [phone]

## 2020-12-22 IMAGING — MG DIGITAL SCREENING BILAT W/ TOMO W/ CAD
6 of 10 series · 6 of 30 positions shown · non-contrast
Comparison: Previous exam(s).

CLINICAL DATA: Screening.

EXAM:
DIGITAL SCREENING BILATERAL MAMMOGRAM WITH TOMO AND CAD

[R CC synth-2D]
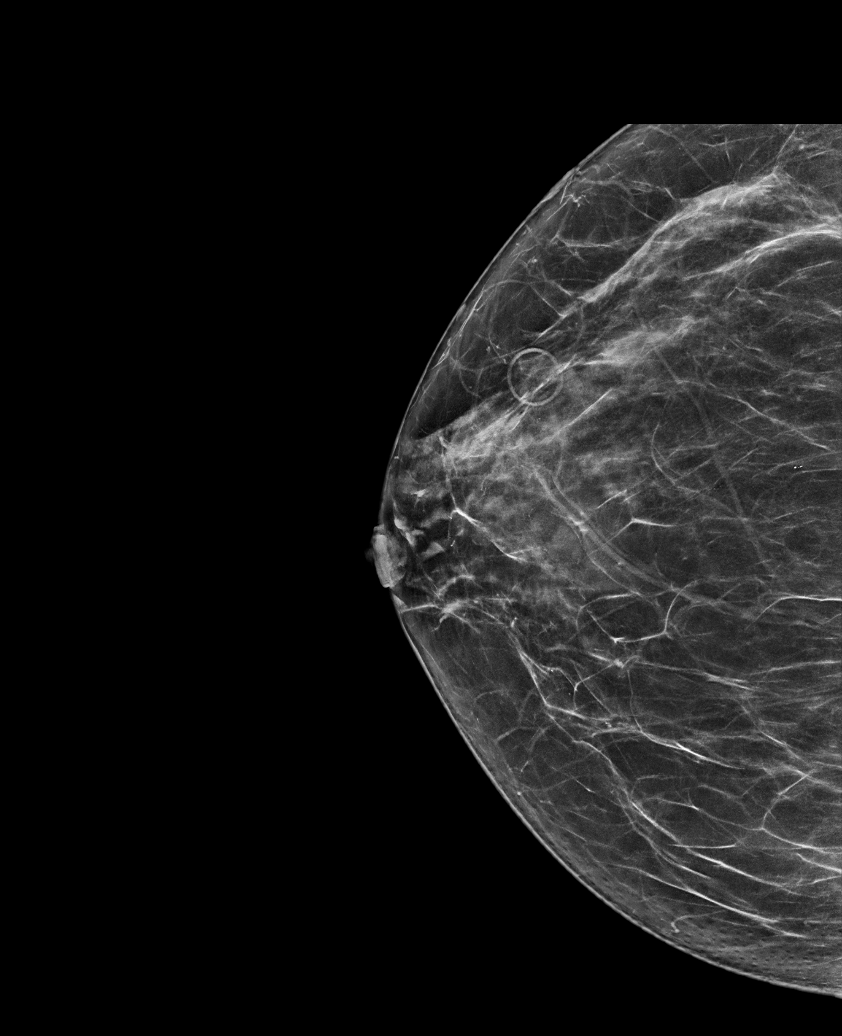

[R MLO synth-2D]
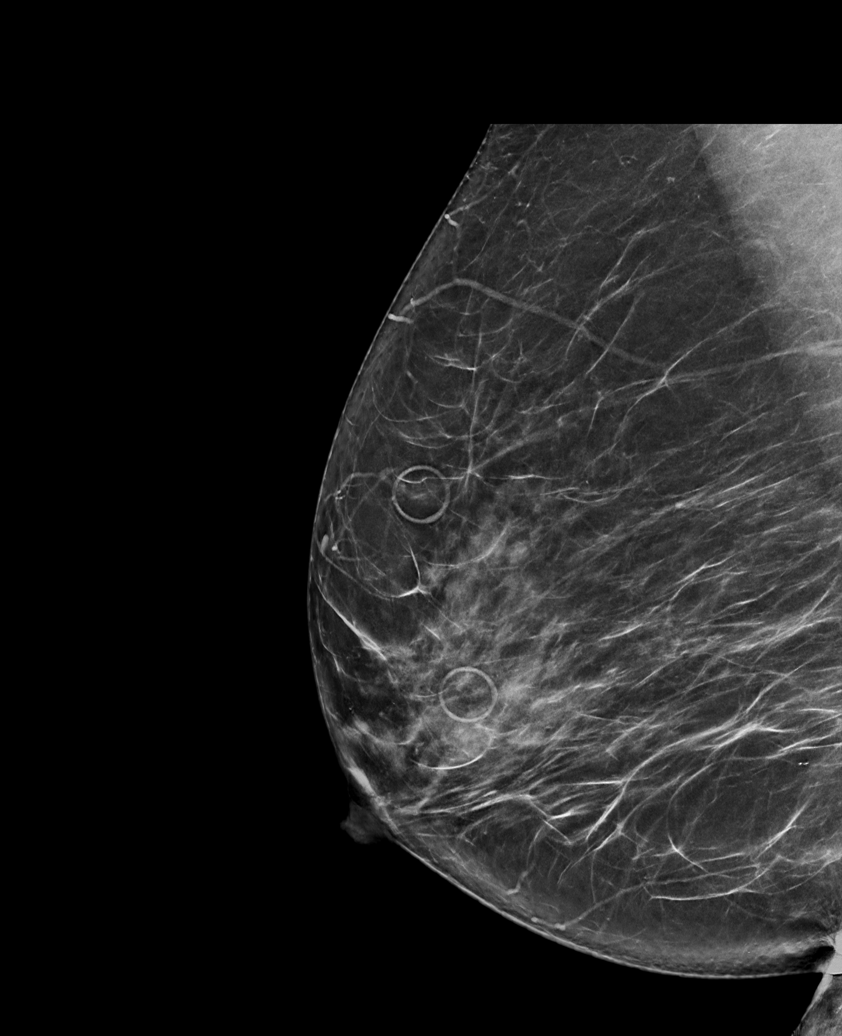

[L MLO synth-2D]
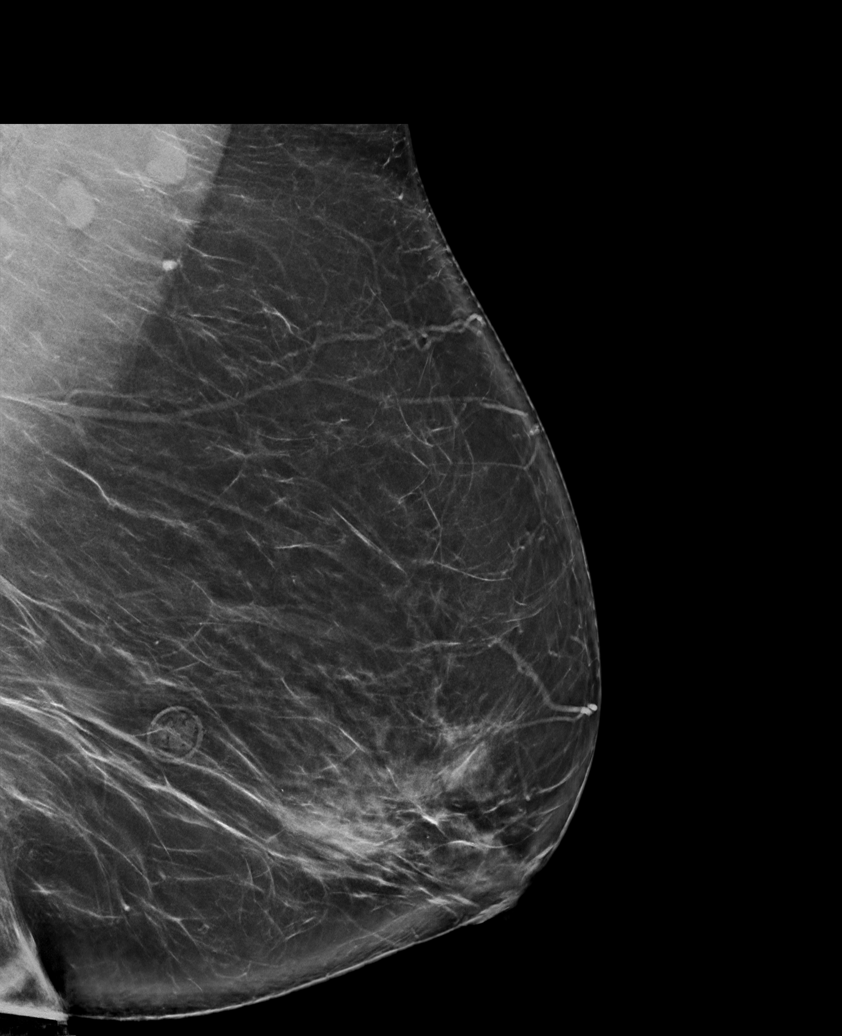

[L CC synth-2D (1 of 2)]
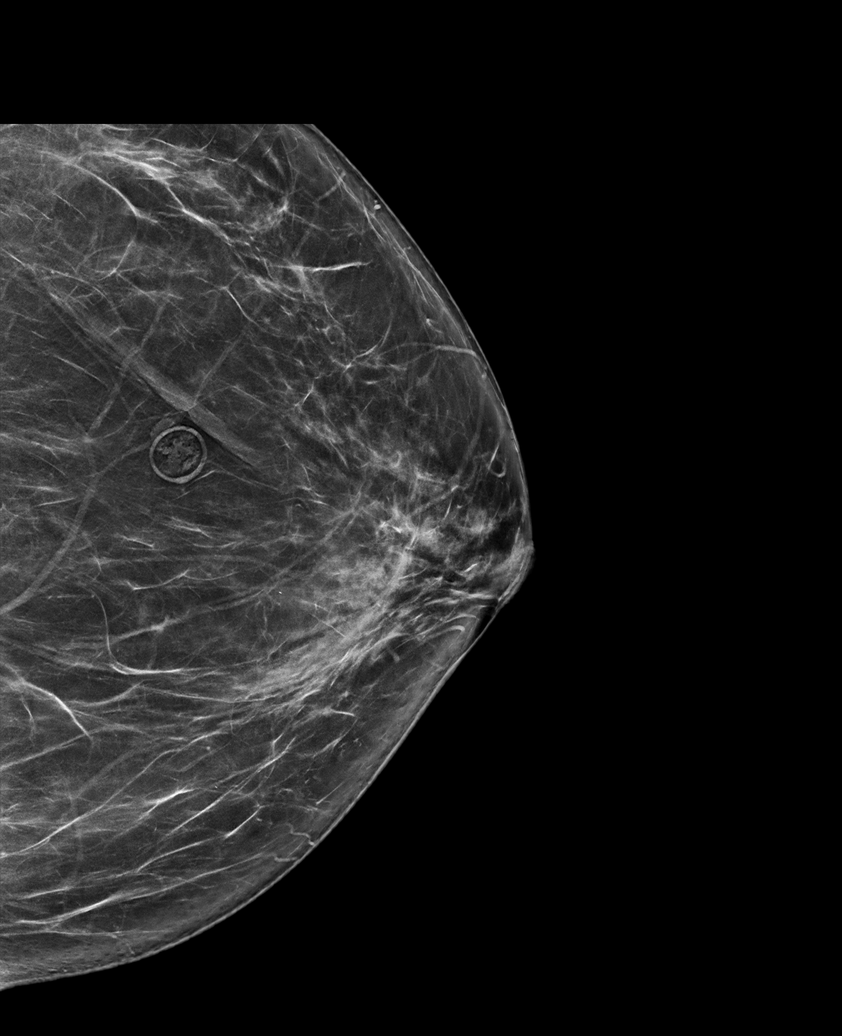

[L CC synth-2D (2 of 2)]
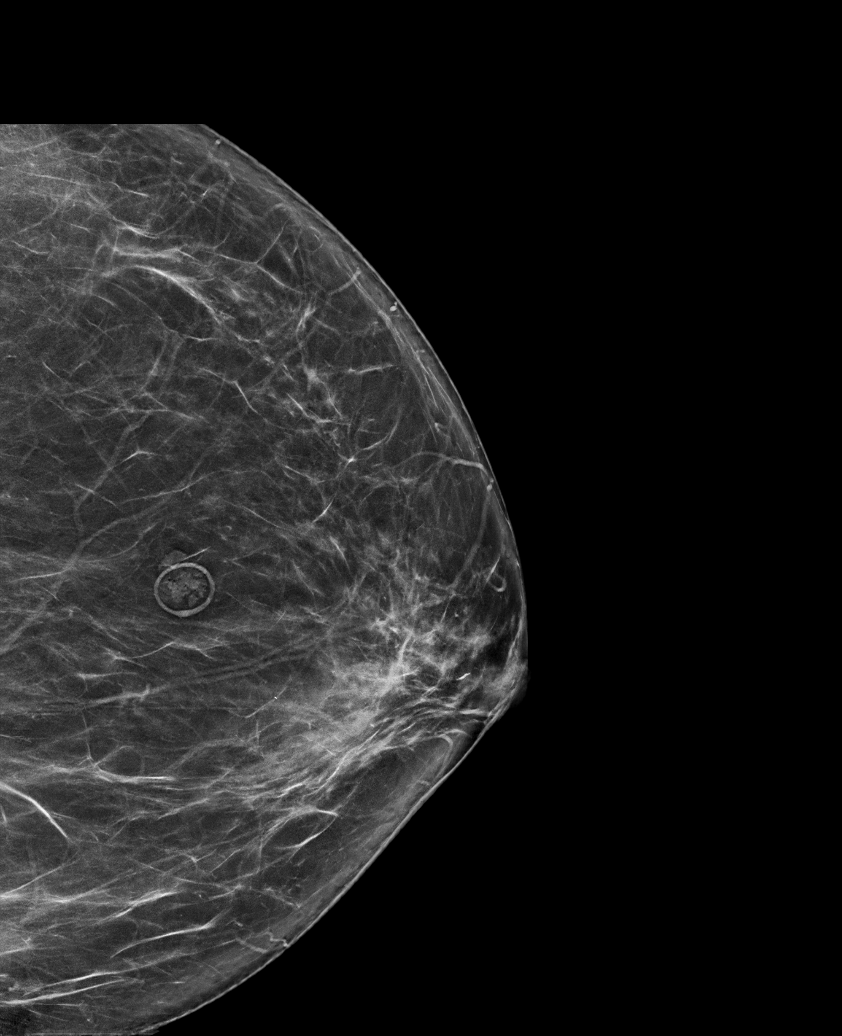

[L CC tomo · tomo slice 41/82.0]
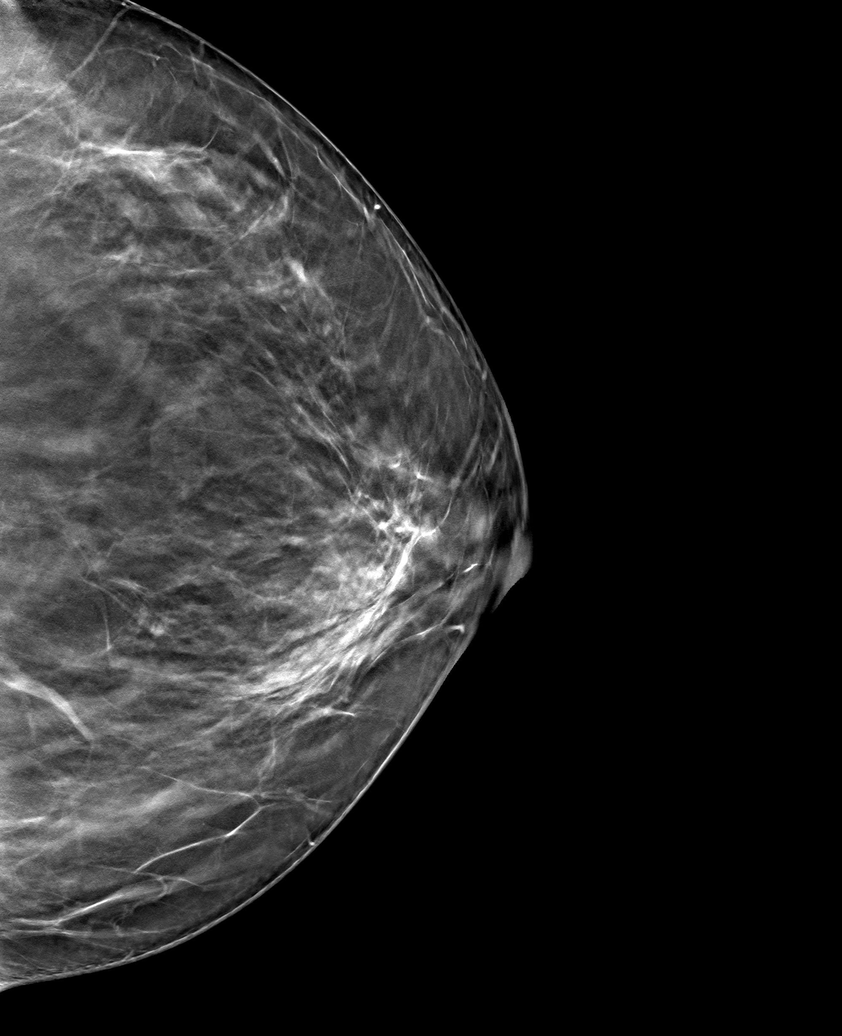

[6 of 30 positions shown; findings below may reference images not displayed]

ACR Breast Density Category b: There are scattered areas of
fibroglandular density.
FINDINGS: There are no findings suspicious for malignancy. Images were
processed with CAD.
IMPRESSION: No mammographic evidence of malignancy. A result letter of this
screening mammogram will be mailed directly to the patient.

RECOMMENDATION:
Screening mammogram in one year. (Code:[id number])

BI-RADS CATEGORY  1: Negative.

## 2021-01-22 ENCOUNTER — Other Ambulatory Visit: Payer: Self-pay | Admitting: Internal Medicine

## 2021-01-22 DIAGNOSIS — Z1231 Encounter for screening mammogram for malignant neoplasm of breast: Secondary | ICD-10-CM

## 2021-02-19 ENCOUNTER — Ambulatory Visit: Payer: Medicare HMO

## 2021-02-22 ENCOUNTER — Ambulatory Visit
Admission: RE | Admit: 2021-02-22 | Discharge: 2021-02-22 | Disposition: A | Discharge status: Home / Self Care | Payer: Medicare HMO | Source: Ambulatory Visit | Attending: Internal Medicine | Admitting: Internal Medicine

## 2021-02-22 ENCOUNTER — Other Ambulatory Visit: Payer: Self-pay

## 2021-02-22 DIAGNOSIS — Z1231 Encounter for screening mammogram for malignant neoplasm of breast: Secondary | ICD-10-CM

## 2021-05-31 ENCOUNTER — Other Ambulatory Visit: Payer: Self-pay | Admitting: Cardiology

## 2021-05-31 DIAGNOSIS — I10 Essential (primary) hypertension: Secondary | ICD-10-CM

## 2021-07-03 ENCOUNTER — Ambulatory Visit: Payer: Self-pay

## 2021-07-03 ENCOUNTER — Other Ambulatory Visit: Payer: Self-pay

## 2021-07-03 ENCOUNTER — Encounter: Payer: Self-pay | Admitting: Physical Medicine and Rehabilitation

## 2021-07-03 ENCOUNTER — Ambulatory Visit (INDEPENDENT_AMBULATORY_CARE_PROVIDER_SITE_OTHER): Payer: Medicare HMO | Admitting: Physical Medicine and Rehabilitation

## 2021-07-03 VITALS — BP 142/91 | HR 93

## 2021-07-03 DIAGNOSIS — M47816 Spondylosis without myelopathy or radiculopathy, lumbar region: Secondary | ICD-10-CM

## 2021-07-03 MED ORDER — BUPIVACAINE HCL 0.5 % IJ SOLN
3.0000 mL | Freq: Once | INTRAMUSCULAR | Status: AC
  Administered 2021-07-03: 3 mL

## 2021-07-03 NOTE — Patient Instructions (Signed)

## 2021-07-03 NOTE — Progress Notes (Signed)
Pt state lower back pain that travels to her left leg to her toe. Pt state walking for a long period of time makes the pain worse. Pt state she stop to rest and take pain meds to help ease the pain. ? ?Numeric Pain Rating Scale and Functional Assessment ?Average Pain 6 ? ? ?In the last MONTH (on 0-10 scale) has pain interfered with the following? ? ?1. General activity like being  able to carry out your everyday physical activities such as walking, climbing stairs, carrying groceries, or moving a chair?  ?Rating(8) ? ? ?+Driver, -BT, -Dye Allergies. ? ?  ?

## 2021-07-08 ENCOUNTER — Telehealth: Payer: Self-pay | Admitting: Physical Medicine and Rehabilitation

## 2021-07-08 DIAGNOSIS — Z981 Arthrodesis status: Secondary | ICD-10-CM

## 2021-07-08 DIAGNOSIS — M47816 Spondylosis without myelopathy or radiculopathy, lumbar region: Secondary | ICD-10-CM

## 2021-07-08 NOTE — Telephone Encounter (Signed)
2nd set of medial Branch blocks seemed successful, pain diary under Media Tab. Try to get RFA. ?

## 2021-07-16 NOTE — Procedures (Signed)
Lumbar Diagnostic Facet Joint Nerve Block with Fluoroscopic Guidance  ? ?Patient: Ariel Fisher      ?Date of Birth: 12-03-1945 ?MRN: [id number] ?PCP: Andi Devon, MD      ?Visit Date: 07/03/2021 ?  ?Universal Protocol:    ?Date/Time: 07/17/2310:13 PM ? ?Consent Given By: the patient ? ?Position: PRONE ? ?Additional Comments: ?Vital signs were monitored before and after the procedure. ?Patient was prepped and draped in the usual sterile fashion. ?The correct patient, procedure, and site was verified. ? ? ?Injection Procedure Details:  ? ?Procedure diagnoses:  ?1. Spondylosis without myelopathy or radiculopathy, lumbar region   ?  ? ?Meds Administered:  ?Meds ordered this encounter  ?Medications  ? bupivacaine (MARCAINE) 0.5 % (with pres) injection 3 mL  ?  ? ?Laterality: Bilateral ? ?Location/Site: L5-S1, L4 medial branch and L5 dorsal ramus ? ?Needle: 5.0 in., 25 ga.  Short bevel or Quincke spinal needle ? ?Needle Placement: Oblique pedical ? ?Findings: ? ? -Comments: There was excellent flow of contrast along the articular pillars without intravascular flow. ? ?Procedure Details: ?The fluoroscope beam is vertically oriented in AP and then obliqued 15 to 20 degrees to the ipsilateral side of the desired nerve to achieve the ?Scotty dog? appearance.  The skin over the target area of the junction of the superior articulating process and the transverse process (sacral ala if blocking the L5 dorsal rami) was locally anesthetized with a 1 ml volume of 1% Lidocaine without Epinephrine.  The spinal needle was inserted and advanced in a trajectory view down to the target.  ? ?After contact with periosteum and negative aspirate for blood and CSF, correct placement without intravascular or epidural spread was confirmed by injecting 0.5 ml. of Isovue-250.  A spot radiograph was obtained of this image.   ? ?Next, a 0.5 ml. volume of the injectate described above was injected. The needle was then redirected to the  other facet joint nerves mentioned above if needed. ? ?Prior to the procedure, the patient was given a Pain Diary which was completed for baseline measurements.  After the procedure, the patient rated their pain every 30 minutes and will continue rating at this frequency for a total of 5 hours.  The patient has been asked to complete the Diary and return to [place] by mail, fax or hand delivered as soon as possible. ? ? ?Additional Comments:  ?The patient tolerated the procedure well ?Dressing: 2 x 2 sterile gauze and Band-Aid ?  ? ?Post-procedure details: ?Patient was observed during the procedure. ?Post-procedure instructions were reviewed. ? ?Patient left the clinic in stable condition. ?

## 2021-07-16 NOTE — Progress Notes (Signed)
? ?Ariel Fisher - 76 y.o. female MRN [id number]  Date of birth: [birth year] ? ?Office Visit Note: ?Visit Date: 07/03/2021 ?PCP: Andi Devon, MD ?Referred by: Andi Devon, MD ? ?Subjective: ?Chief Complaint  ?Patient presents with  ? Lower Back - Pain  ? ?HPI:  Ariel Fisher is a 76 y.o. female who comes in today for planned repeat Bilateral L5-S1 Lumbar facet/medial branch block with fluoroscopic guidance.  The patient has failed conservative care including home exercise, medications, time and activity modification.  This injection will be diagnostic and hopefully therapeutic.  Please see requesting physician notes for further details and justification.  Exam shows concordant low back pain with facet joint loading and extension. Patient received more than 80% pain relief from prior injection. This would be the second block in a diagnostic double block paradigm.  Her case is somewhat difficult with difficult historian over time.  We last saw her almost a year ago for diagnostic blocks that she said helped more than 80% but short-lived that we did not see her back in follow-up and that she was having other problems and just did not come back to see [place].  She has had prior lumbar fusion at L4-5 by Dr. Delma Officer.  She continues to have mainly axial low back pain.  At this point we are going to repeat the injection because she did get relief.  Could consider radiofrequency ablation of the L5-S1 facet joints.  She really needs probably updated lumbar spine MRI as she also does get some pain in the left thigh and leg but this is minor intermittent. ? ? ? ? ?Referring: Darcella Cheshire, MD, original ? ?ROS Otherwise per HPI. ? ?Assessment & Plan: ?Visit Diagnoses:  ?  ICD-10-CM   ?1. Spondylosis without myelopathy or radiculopathy, lumbar region  M47.816 XR C-ARM NO REPORT  ?  Facet Injection  ?  bupivacaine (MARCAINE) 0.5 % (with pres) injection 3 mL  ?  ?  ?Plan: No additional findings.  ? ?Meds &  Orders:  ?Meds ordered this encounter  ?Medications  ? bupivacaine (MARCAINE) 0.5 % (with pres) injection 3 mL  ?  ?Orders Placed This Encounter  ?Procedures  ? Facet Injection  ? XR C-ARM NO REPORT  ?  ?Follow-up: Return for Review Pain Diary.  ? ?Procedures: ?No procedures performed  ?Lumbar Diagnostic Facet Joint Nerve Block with Fluoroscopic Guidance  ? ?Patient: Ariel Fisher      ?Date of Birth: [birth year] ?MRN: [id number] ?PCP: Andi Devon, MD      ?Visit Date: 07/03/2021 ?  ?Universal Protocol:    ?Date/Time: 07/17/2310:13 PM ? ?Consent Given By: the patient ? ?Position: PRONE ? ?Additional Comments: ?Vital signs were monitored before and after the procedure. ?Patient was prepped and draped in the usual sterile fashion. ?The correct patient, procedure, and site was verified. ? ? ?Injection Procedure Details:  ? ?Procedure diagnoses:  ?1. Spondylosis without myelopathy or radiculopathy, lumbar region   ?  ? ?Meds Administered:  ?Meds ordered this encounter  ?Medications  ? bupivacaine (MARCAINE) 0.5 % (with pres) injection 3 mL  ?  ? ?Laterality: Bilateral ? ?Location/Site: L5-S1, L4 medial branch and L5 dorsal ramus ? ?Needle: 5.0 in., 25 ga.  Short bevel or Quincke spinal needle ? ?Needle Placement: Oblique pedical ? ?Findings: ? ? -Comments: There was excellent flow of contrast along the articular pillars without intravascular flow. ? ?Procedure Details: ?The fluoroscope beam is vertically oriented in AP and then obliqued 15 to 20  degrees to the ipsilateral side of the desired nerve to achieve the ?Scotty dog? appearance.  The skin over the target area of the junction of the superior articulating process and the transverse process (sacral ala if blocking the L5 dorsal rami) was locally anesthetized with a 1 ml volume of 1% Lidocaine without Epinephrine.  The spinal needle was inserted and advanced in a trajectory view down to the target.  ? ?After contact with periosteum and negative aspirate for  blood and CSF, correct placement without intravascular or epidural spread was confirmed by injecting 0.5 ml. of Isovue-250.  A spot radiograph was obtained of this image.   ? ?Next, a 0.5 ml. volume of the injectate described above was injected. The needle was then redirected to the other facet joint nerves mentioned above if needed. ? ?Prior to the procedure, the patient was given a Pain Diary which was completed for baseline measurements.  After the procedure, the patient rated their pain every 30 minutes and will continue rating at this frequency for a total of 5 hours.  The patient has been asked to complete the Diary and return to [place] by mail, fax or hand delivered as soon as possible. ? ? ?Additional Comments:  ?The patient tolerated the procedure well ?Dressing: 2 x 2 sterile gauze and Band-Aid ?  ? ?Post-procedure details: ?Patient was observed during the procedure. ?Post-procedure instructions were reviewed. ? ?Patient left the clinic in stable condition.  ? ?Clinical History: ?07/10/2020 2 View Lumbar Spine ?AP and lateral lumbar spine shows moderate degenerative joint disease in  ?the left hip and mild to moderate in the right hip.  Lumbar spine with  ?normal anatomic alignment with lumbar instrumented fusion at L4-5.  There  ?is facet arthropathy bilaterally at L5-S1 below the fusion with some  ?foraminal narrowing.  No fractures or dislocations.  ? ? ? ?Objective:  VS:  HT:    WT:   BMI:     BP:(!) 142/91  HR:93bpm  TEMP: ( )  RESP:  ?Physical Exam ?Vitals and nursing note reviewed.  ?Constitutional:   ?   General: She is not in acute distress. ?   Appearance: Normal appearance. She is not ill-appearing.  ?HENT:  ?   Head: Normocephalic and atraumatic.  ?   Right Ear: External ear normal.  ?   Left Ear: External ear normal.  ?Eyes:  ?   Extraocular Movements: Extraocular movements intact.  ?Cardiovascular:  ?   Rate and Rhythm: Normal rate.  ?   Pulses: Normal pulses.  ?Pulmonary:  ?   Effort:  Pulmonary effort is normal. No respiratory distress.  ?Abdominal:  ?   General: There is no distension.  ?   Palpations: Abdomen is soft.  ?Musculoskeletal:     ?   General: Tenderness present.  ?   Cervical back: Neck supple.  ?   Right lower leg: No edema.  ?   Left lower leg: No edema.  ?   Comments: Patient has good distal strength with no pain over the greater trochanters.  No clonus or focal weakness.  ?Skin: ?   Findings: No erythema, lesion or rash.  ?Neurological:  ?   General: No focal deficit present.  ?   Mental Status: She is alert and oriented to person, place, and time.  ?   Sensory: No sensory deficit.  ?   Motor: No weakness or abnormal muscle tone.  ?   Coordination: Coordination normal.  ?Psychiatric:     ?  Mood and Affect: Mood normal.     ?   Behavior: Behavior normal.  ?  ? ?Imaging: ?No results found. ?

## 2021-08-20 ENCOUNTER — Ambulatory Visit (INDEPENDENT_AMBULATORY_CARE_PROVIDER_SITE_OTHER): Payer: Medicare HMO | Admitting: Physical Medicine and Rehabilitation

## 2021-08-20 ENCOUNTER — Encounter: Payer: Self-pay | Admitting: Physical Medicine and Rehabilitation

## 2021-08-20 ENCOUNTER — Ambulatory Visit: Payer: Self-pay

## 2021-08-20 VITALS — BP 152/93 | HR 88

## 2021-08-20 DIAGNOSIS — M47816 Spondylosis without myelopathy or radiculopathy, lumbar region: Secondary | ICD-10-CM | POA: Diagnosis not present

## 2021-08-20 MED ORDER — METHYLPREDNISOLONE ACETATE 80 MG/ML IJ SUSP
80.0000 mg | Freq: Once | INTRAMUSCULAR | Status: AC
  Administered 2021-08-20: 80 mg

## 2021-08-20 NOTE — Patient Instructions (Signed)

## 2021-08-20 NOTE — Progress Notes (Signed)
Pt state lower back pain. Pt state walking for a long period of time makes the pain worse. Pt state she stop to rest and take pain meds to help ease the pain  Numeric Pain Rating Scale and Functional Assessment Average Pain 5   In the last MONTH (on 0-10 scale) has pain interfered with the following?  1. General activity like being  able to carry out your everyday physical activities such as walking, climbing stairs, carrying groceries, or moving a chair?  Rating(8)   +Driver, -BT, -Dye Allergies.

## 2021-09-09 NOTE — Procedures (Signed)
Lumbar Facet Joint Nerve Denervation  Patient: Ariel Fisher      Date of Birth: 06/29/1945 MRN: [id number] PCP: Andi Devon, MD      Visit Date: 08/20/2021   Universal Protocol:    Date/Time: 05/22/237:52 PM  Consent Given By: the patient  Position: PRONE  Additional Comments: Vital signs were monitored before and after the procedure. Patient was prepped and draped in the usual sterile fashion. The correct patient, procedure, and site was verified.   Injection Procedure Details:   Procedure diagnoses:  1. Spondylosis without myelopathy or radiculopathy, lumbar region      Meds Administered:  Meds ordered this encounter  Medications   methylPREDNISolone acetate (DEPO-MEDROL) injection 80 mg     Laterality: Bilateral  Location/Site:  L5-S1, L4 medial branch and L5 dorsal ramus  Needle: 18 ga.,  50mm active tip, RF Cannula  Needle Placement: Along juncture of superior articular process and transverse pocess  Findings:  -Comments:  Procedure Details: For each desired target nerve, the corresponding transverse process (sacral ala for the L5 dorsal rami) was identified and the fluoroscope was positioned to square off the endplates of the corresponding vertebral body to achieve a true AP midline view.  The beam was then obliqued 15 to 20 degrees and caudally tilted 15 to 20 degrees to line up a trajectory along the target nerves. The skin over the target of the junction of superior articulating process and transverse process (sacral ala for the L5 dorsal rami) was infiltrated with 13ml of 1% Lidocaine without Epinephrine.  The 18 gauge 34mm active tip outer cannula was advanced in trajectory view to the target.  This procedure was repeated for each target nerve.  Then, for all levels, the outer cannula placement was fine-tuned and the position was then confirmed with bi-planar imaging.    Test stimulation was done both at sensory and motor levels to ensure  there was no radicular stimulation. The target tissues were then infiltrated with 1 ml of 1% Lidocaine without Epinephrine. Subsequently, a percutaneous neurotomy was carried out for 90 seconds at 80 degrees Celsius.  After the completion of the lesion, 1 ml of injectate was delivered. It was then repeated for each facet joint nerve mentioned above. Appropriate radiographs were obtained to verify the probe placement during the neurotomy.   Additional Comments:  The patient tolerated the procedure well Dressing: 2 x 2 sterile gauze and Band-Aid    Post-procedure details: Patient was observed during the procedure. Post-procedure instructions were reviewed.  Patient left the clinic in stable condition.

## 2021-09-09 NOTE — Progress Notes (Signed)
Ariel Fisher - 76 y.o. female MRN [id number]  Date of birth: February 26, 1946  Office Visit Note: Visit Date: 08/20/2021 PCP: Andi Devon, MD Referred by: Tyrell Antonio, MD  Subjective: Chief Complaint  Patient presents with   Lower Back - Pain   Left Leg - Pain   HPI:  Ariel Fisher is a 76 y.o. female who comes in todayfor planned radiofrequency ablation of the Bilateral L5-S1 Lumbar facet joints. This would be ablation of the corresponding medial branches and/or dorsal rami.  Patient has had double diagnostic blocks with more than 50% relief.  These are documented on pain diary.  They have had chronic back pain for quite some time, more than 3 months, which has been an ongoing situation with recalcitrant axial back pain.  They have no radicular pain.  Their axial pain is worse with standing and ambulating and on exam today with facet loading.  They have had physical therapy as well as home exercise program.  The imaging noted in the chart below indicated facet pathology. Accordingly they meet all the criteria and qualification for for radiofrequency ablation and we are going to complete this today hopefully for more longer term relief as part of comprehensive management program.   Referring: Herminio Heads, MD  ROS Otherwise per HPI.  Assessment & Plan: Visit Diagnoses:    ICD-10-CM   1. Spondylosis without myelopathy or radiculopathy, lumbar region  M47.816 XR C-ARM NO REPORT    Radiofrequency,Lumbar    methylPREDNISolone acetate (DEPO-MEDROL) injection 80 mg      Plan: No additional findings.   Meds & Orders:  Meds ordered this encounter  Medications   methylPREDNISolone acetate (DEPO-MEDROL) injection 80 mg    Orders Placed This Encounter  Procedures   Radiofrequency,Lumbar   XR C-ARM NO REPORT    Follow-up: Return for visit to requesting provider as needed.   Procedures: No procedures performed  Lumbar Facet Joint Nerve Denervation  Patient:  Ariel Fisher      Date of Birth: [birth year] MRN: [id number] PCP: Andi Devon, MD      Visit Date: 08/20/2021   Universal Protocol:    Date/Time: 05/22/237:52 PM  Consent Given By: the patient  Position: PRONE  Additional Comments: Vital signs were monitored before and after the procedure. Patient was prepped and draped in the usual sterile fashion. The correct patient, procedure, and site was verified.   Injection Procedure Details:   Procedure diagnoses:  1. Spondylosis without myelopathy or radiculopathy, lumbar region      Meds Administered:  Meds ordered this encounter  Medications   methylPREDNISolone acetate (DEPO-MEDROL) injection 80 mg     Laterality: Bilateral  Location/Site:  L5-S1, L4 medial branch and L5 dorsal ramus  Needle: 18 ga.,  56mm active tip, RF Cannula  Needle Placement: Along juncture of superior articular process and transverse pocess  Findings:  -Comments:  Procedure Details: For each desired target nerve, the corresponding transverse process (sacral ala for the L5 dorsal rami) was identified and the fluoroscope was positioned to square off the endplates of the corresponding vertebral body to achieve a true AP midline view.  The beam was then obliqued 15 to 20 degrees and caudally tilted 15 to 20 degrees to line up a trajectory along the target nerves. The skin over the target of the junction of superior articulating process and transverse process (sacral ala for the L5 dorsal rami) was infiltrated with 2ml of 1% Lidocaine without Epinephrine.  The 18 gauge 45mm  active tip outer cannula was advanced in trajectory view to the target.  This procedure was repeated for each target nerve.  Then, for all levels, the outer cannula placement was fine-tuned and the position was then confirmed with bi-planar imaging.    Test stimulation was done both at sensory and motor levels to ensure there was no radicular stimulation. The target  tissues were then infiltrated with 1 ml of 1% Lidocaine without Epinephrine. Subsequently, a percutaneous neurotomy was carried out for 90 seconds at 80 degrees Celsius.  After the completion of the lesion, 1 ml of injectate was delivered. It was then repeated for each facet joint nerve mentioned above. Appropriate radiographs were obtained to verify the probe placement during the neurotomy.   Additional Comments:  The patient tolerated the procedure well Dressing: 2 x 2 sterile gauze and Band-Aid    Post-procedure details: Patient was observed during the procedure. Post-procedure instructions were reviewed.  Patient left the clinic in stable condition.      Clinical History: 07/10/2020 2 View Lumbar Spine AP and lateral lumbar spine shows moderate degenerative joint disease in  the left hip and mild to moderate in the right hip.  Lumbar spine with  normal anatomic alignment with lumbar instrumented fusion at L4-5.  There  is facet arthropathy bilaterally at L5-S1 below the fusion with some  foraminal narrowing.  No fractures or dislocations.  ---  MRI of the lumbar spine without intravenous contrast performed on 01/17/2016 5:44 PM.   COMPARISON: No comparison available.   TECHNIQUE: Multiplanar, multisequence MR imaging obtained through the lumbar spine without contrast on 01/17/2016 5:44 PM.   FINDINGS:  #  Osseous structures: Prior posterior decompression with hardware fixation and interbody spacer at L4-L5. The vertebral body heights and marrow signal are unremarkable. No evidence of an acute fracture. Thoracolumbar anterolateral osteophytes are  appreciated.  #  Alignment:Alignment maintained.  #  Conus medullaris/cauda equina: Spinal cord signal and termination are within normal limits.   #  Lower thoracic spine: No significant spinal canal or foraminal stenosis. This is viewed on the sagittal images only.   #  T12-L1: No significant disc herniation, spinal canal or  neuroforaminal compromise. This is viewed on sagittal images only.  #  L1-L2: No significant disc herniation, spinal canal or neuroforaminal compromise. This is viewed on the sagittal images only.  #  L2-L3: No significant disc herniation, spinal canal or neural foraminal compromise.  #  L3-L4: Mild facet hypertrophy on the LEFT. No significant disc herniation, spinal canal or neural foraminal compromise.  #  L4-L5: Postoperative changes. Spinal canal and foramen are patent.  #  L5-S1: Facet hypertrophy on the RIGHT. No significant disc herniation. Mild RIGHT neuroforaminal stenosis. The LEFT neuroforamen and spinal canal are patent.   #  Paraspinal tissues: Postoperative changes at L4-L5.   #  Additional comments: Multiple sigmoid diverticula are appreciated. There is a 1.5 hyperintense T2-weighted lesion in the RIGHT kidney. A 6 mm cyst like lesion is present in the LEFT kidney.     Objective:  VS:  HT:    WT:   BMI:     BP:(!) 152/93  HR:88bpm  TEMP: ( )  RESP:  Physical Exam Vitals and nursing note reviewed.  Constitutional:      General: She is not in acute distress.    Appearance: Normal appearance. She is not ill-appearing.  HENT:     Head: Normocephalic and atraumatic.     Right Ear: External ear normal.  Left Ear: External ear normal.  Eyes:     Extraocular Movements: Extraocular movements intact.  Cardiovascular:     Rate and Rhythm: Normal rate.     Pulses: Normal pulses.  Pulmonary:     Effort: Pulmonary effort is normal. No respiratory distress.  Abdominal:     General: There is no distension.     Palpations: Abdomen is soft.  Musculoskeletal:        General: Tenderness present.     Cervical back: Neck supple.     Right lower leg: No edema.     Left lower leg: No edema.     Comments: Patient has good distal strength with no pain over the greater trochanters.  No clonus or focal weakness. Patient somewhat slow to rise from a seated position to full  extension.  There is concordant low back pain with facet loading and lumbar spine extension rotation.  There are no definitive trigger points but the patient is somewhat tender across the lower back and PSIS.  There is no pain with hip rotation.   Skin:    Findings: No erythema, lesion or rash.  Neurological:     General: No focal deficit present.     Mental Status: She is alert and oriented to person, place, and time.     Sensory: No sensory deficit.     Motor: No weakness or abnormal muscle tone.     Coordination: Coordination normal.  Psychiatric:        Mood and Affect: Mood normal.        Behavior: Behavior normal.     Imaging: No results found.

## 2022-01-10 ENCOUNTER — Other Ambulatory Visit: Payer: Self-pay | Admitting: Internal Medicine

## 2022-01-10 DIAGNOSIS — Z1231 Encounter for screening mammogram for malignant neoplasm of breast: Secondary | ICD-10-CM

## 2022-02-24 ENCOUNTER — Ambulatory Visit
Admission: RE | Admit: 2022-02-24 | Discharge: 2022-02-24 | Disposition: A | Discharge status: Home / Self Care | Payer: Medicare HMO | Source: Ambulatory Visit | Attending: Internal Medicine | Admitting: Internal Medicine

## 2022-02-24 DIAGNOSIS — Z1231 Encounter for screening mammogram for malignant neoplasm of breast: Secondary | ICD-10-CM

## 2022-05-03 ENCOUNTER — Other Ambulatory Visit: Payer: Self-pay | Admitting: Cardiology

## 2022-05-03 DIAGNOSIS — I10 Essential (primary) hypertension: Secondary | ICD-10-CM

## 2022-06-10 ENCOUNTER — Other Ambulatory Visit: Payer: Self-pay | Admitting: Family Medicine

## 2022-06-10 DIAGNOSIS — M545 Low back pain, unspecified: Secondary | ICD-10-CM

## 2022-06-12 ENCOUNTER — Other Ambulatory Visit: Payer: Self-pay | Admitting: Cardiology

## 2022-06-12 DIAGNOSIS — I10 Essential (primary) hypertension: Secondary | ICD-10-CM

## 2022-07-04 ENCOUNTER — Ambulatory Visit
Admission: RE | Admit: 2022-07-04 | Discharge: 2022-07-04 | Disposition: A | Discharge status: Home / Self Care | Payer: Medicare HMO | Source: Ambulatory Visit | Attending: Family Medicine | Admitting: Family Medicine

## 2022-07-04 DIAGNOSIS — M545 Low back pain, unspecified: Secondary | ICD-10-CM

## 2022-08-28 ENCOUNTER — Telehealth: Payer: Self-pay | Admitting: Physical Medicine and Rehabilitation

## 2022-08-28 NOTE — Telephone Encounter (Signed)
Patient advising that she wants an appointment to get an injection

## 2022-08-29 ENCOUNTER — Telehealth: Payer: Self-pay | Admitting: Physical Medicine and Rehabilitation

## 2022-08-29 NOTE — Telephone Encounter (Signed)
LVM to return call to get more information 

## 2022-08-29 NOTE — Telephone Encounter (Signed)
Patient called. Returning a call to Brittney.   

## 2022-09-01 NOTE — Telephone Encounter (Signed)
LVM to return call to schedule OV 

## 2022-09-02 NOTE — Telephone Encounter (Signed)
Spoke with patient and scheduled OV for 09/03/22.  

## 2022-09-03 ENCOUNTER — Encounter: Payer: Self-pay | Admitting: Physical Medicine and Rehabilitation

## 2022-09-03 ENCOUNTER — Ambulatory Visit: Payer: Medicare HMO | Admitting: Physical Medicine and Rehabilitation

## 2022-09-03 DIAGNOSIS — M545 Low back pain, unspecified: Secondary | ICD-10-CM | POA: Diagnosis not present

## 2022-09-03 DIAGNOSIS — M47816 Spondylosis without myelopathy or radiculopathy, lumbar region: Secondary | ICD-10-CM

## 2022-09-03 DIAGNOSIS — Z981 Arthrodesis status: Secondary | ICD-10-CM | POA: Diagnosis not present

## 2022-09-03 DIAGNOSIS — G8929 Other chronic pain: Secondary | ICD-10-CM | POA: Diagnosis not present

## 2022-09-03 MED ORDER — PREDNISONE 50 MG PO TABS: 50.0000 mg | ORAL_TABLET | Freq: Every day | ORAL | 0 refills | Status: AC

## 2022-09-03 MED ORDER — MELOXICAM 15 MG PO TABS: 15.0000 mg | ORAL_TABLET | Freq: Every day | ORAL | 0 refills | Status: DC

## 2022-09-03 NOTE — Progress Notes (Signed)
Ariel Fisher - 77 y.o. female MRN [id number]  Date of birth: 1946-01-14  Office Visit Note: Visit Date: 09/03/2022 PCP: Andi Devon, MD Referred by: Andi Devon, MD  Subjective: Chief Complaint  Patient presents with   Lower Back - Pain   HPI: Ariel Fisher is a 77 y.o. female who comes in today for evaluation of chronic, worsening and severe bilateral lower back pain, intermittent radiation of pain to legs. Pain ongoing for several years, worsened over the last several weeks after helping move her husband onto the bed. She describes pain as tight and sore sensation, currently rates as 10 out of 10. Reports severe pain when moving from sitting to standing position.  Some relief of pain with home exercise regimen, rest and use of medications. Does take 600 mg of Gabapentin at bedtime. No history of formal physical therapy for chronic lower back issues. Patient currently treated by Dr. Eula Listen at [facility] whom recently obtained new lumbar MRI imaging. 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 There is also moderate multifactorial spinal canal stenosis at the level of L2-L3. History of posterior lumbar fusion at L4-L5 by Dr. Tressie Stalker in 2015. Patient underwent bilateral L5-S1 radiofrequency ablation in our office on 08/20/2021, she reports greater than 80% relief of pain for over 8 months with this procedures. She also reports increased functional ability post ablation. Patient reports increased stress recently due to husband having stroke, she is helping to care for him at home. Patient denies focal weakness, numbness and tingling. No recent trauma or falls.    Review of Systems  Musculoskeletal:  Positive for back pain.  Neurological:  Negative for tingling, sensory change, focal weakness and weakness.  All other systems reviewed and are negative.   Otherwise per HPI.  Assessment & Plan: Visit Diagnoses:    ICD-10-CM   1. Chronic bilateral low back pain without sciatica  M54.50 Ambulatory referral to Physical Medicine Rehab   G89.29     2. Spondylosis without myelopathy or radiculopathy, lumbar region  M47.816 Ambulatory referral to Physical Medicine Rehab    3. Facet arthropathy, lumbar  M47.816 Ambulatory referral to Physical Medicine Rehab    4. S/P lumbar spinal fusion  Z98.1 Ambulatory referral to Physical Medicine Rehab       Plan: Findings:  Chronic, worsening and severe bilateral lower back pain. Intermittent radiation of pain to legs. Patient continues to have severe pain despite good conservative therapies such as home exercise regimen, rest and use of medications. Patients clinical presentation and exam are consistent with facet mediated pain. 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 There is bilateral facet arthropathy at L5-S1. She does have pain with lumbar extension today. Next step is to repeat bilateral L5-S1 radiofrequency ablation under fluoroscopic guidance. If her pain declares itself as more radicular in nature we would consider performing transforaminal injection at the level of stenosis. I do feel it would be beneficial for patient to re-group with physical therapy for her back, however I understand this may be difficult for her as she is helping to care for her husband. Patient has no questions regarding ablation procedure. I discussed medication management today and prescribed short course of oral Prednisone and Meloxicam. I encouraged patient to remain active as tolerated. No red flag symptoms noted upon exam today.     Meds & Orders:  Meds ordered this encounter  Medications   meloxicam (MOBIC) 15 MG tablet    Sig: Take 1 tablet (15 mg total) by mouth daily.    Dispense:  30 tablet    Refill:  0   predniSONE (DELTASONE) 50 MG tablet    Sig: Take 1 tablet (50 mg  total) by mouth daily with breakfast. Take until completed.    Dispense:  5 tablet    Refill:  0    Orders Placed This Encounter  Procedures   Ambulatory referral to Physical Medicine Rehab    Follow-up: Return for Bilateral L5-S1 radiofrequency ablation.   Procedures: No procedures performed      Clinical History: MRI LUMBAR SPINE WITHOUT CONTRAST   TECHNIQUE: Multiplanar, multisequence MR imaging of the lumbar spine was performed. No intravenous contrast was administered.   COMPARISON:  Radiography 07/10/2020. CT 03/18/2013. MRI 06/12/2012.   FINDINGS: Segmentation:  5 lumbar type vertebral bodies.   Alignment:  No malalignment.   Vertebrae: Some discogenic endplate marrow change at L2-3, including some active edema, which can be associated with regional pain.   Conus medullaris and cauda equina: Conus extends to the L1-2 level. Conus and cauda equina appear normal.   Paraspinal and other soft tissues: Negative   Disc levels:   No disc level abnormality from T10-11 through L1-2.   L2-3: Disc degeneration with loss of disc height. Circumferential bulging. Facet and ligamentous hypertrophy with small joint effusions. Moderate multifactorial stenosis at this level that could be symptomatic from the standpoint of neural compression. The degenerative endplate changes and degenerative facet arthropathy could certainly be painful.   L3-4: Normal appearance of the disc. Minimal facet and ligamentous prominence. No stenosis.   L4-5: Previous posterior decompression, diskectomy and fusion. Solid union with wide patency of the canal and foramina.   L5-S1: Mild desiccation of the disc without bulge or herniation. Bilateral facet osteoarthritis. No compressive narrowing of the canal or foramina. The arthritis could contribute to low back pain or referred facet syndrome pain.   IMPRESSION: 1. Good appearance at the decompression and fusion level of L4-5. 2. L2-3: Disc  degeneration with loss of disc height. Circumferential bulging of the disc. Facet and ligamentous hypertrophy with small joint effusions. Moderate multifactorial stenosis at this level that could be symptomatic from the standpoint of neural compression. The degenerative endplate changes and facet arthropathy could certainly be painful as well. 3. L5-S1: Bilateral facet osteoarthritis. No compressive narrowing of the canal or foramina. The arthritis could contribute to low back pain or referred facet syndrome pain.     Electronically Signed   By: Paulina Fusi M.D.   On: 07/08/2022 16:42   She reports that she has never smoked. She has never used smokeless tobacco. No results for input(s): "HGBA1C", "LABURIC" in the last 8760 hours.  Objective:  VS:  HT:    WT:   BMI:     BP:   HR: bpm  TEMP: ( )  RESP:  Physical Exam Vitals and nursing note reviewed.  HENT:     Head: Normocephalic and atraumatic.     Right Ear: External ear normal.     Left Ear: External ear normal.     Nose: Nose normal.     Mouth/Throat:     Mouth: Mucous membranes are moist.  Eyes:     Extraocular Movements: Extraocular movements intact.  Cardiovascular:     Rate and Rhythm: Normal rate.     Pulses: Normal pulses.  Pulmonary:     Effort: Pulmonary  effort is normal.  Abdominal:     General: Abdomen is flat. There is no distension.  Musculoskeletal:        General: Tenderness present.     Cervical back: Normal range of motion.     Comments: Patient is slow to rise from seated position to standing. Concordant low back pain with facet loading, lumbar spine extension and rotation. 5/5 strength noted with bilateral hip flexion, knee flexion/extension, ankle dorsiflexion/plantarflexion and EHL. No clonus noted bilaterally. No pain upon palpation of greater trochanters. No pain with internal/external rotation of bilateral hips. Sensation intact bilaterally. Negative slump test bilaterally. Ambulates without  aid, gait steady.    Skin:    General: Skin is warm and dry.     Capillary Refill: Capillary refill takes less than 2 seconds.  Neurological:     General: No focal deficit present.     Mental Status: She is alert and oriented to person, place, and time.  Psychiatric:        Mood and Affect: Mood normal.        Behavior: Behavior normal.     Ortho Exam  Imaging: No results found.  Past Medical/Family/Surgical/Social History: Medications & Allergies reviewed per EMR, new medications updated. 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   Spondylolisthesis of lumbar region 08/08/2013   Past Medical History:  Diagnosis Date   Anxiety    Depression    Hypertension    Family History  Problem Relation Age of Onset   Heart disease Mother    Liver disease Father    Non-Hodgkin's lymphoma Sister    Liver disease Sister    Past Surgical History:  Procedure Laterality Date   BILATERAL CARPAL TUNNEL RELEASE     CARDIAC CATHETERIZATION     15 yrs ago ..clean   CESAREAN SECTION     TOTAL KNEE ARTHROPLASTY Left 02/14/2019   Procedure: TOTAL KNEE ARTHROPLASTY;  Surgeon: Dannielle Huh, MD;  Location: [facility];  Service: Orthopedics;  Laterality: Left;  75 mins needed for length of case   TOTAL KNEE ARTHROPLASTY Right 04/11/2019   Procedure: TOTAL KNEE ARTHROPLASTY;  Surgeon: Dannielle Huh, MD;  Location: [facility];  Service: Orthopedics;  Laterality: Right;  SAME DAY DISCHARGE   Social History   Occupational History   Not on file  Tobacco Use   Smoking status: Never   Smokeless tobacco: Never  Vaping Use   Vaping Use: Never used  Substance and Sexual Activity   Alcohol use: Yes    Alcohol/week: 1.0 standard drink of alcohol    Types: 1 Glasses of wine per week    Comment: occ   Drug use: No   Sexual activity: Not on file

## 2022-09-03 NOTE — Progress Notes (Signed)
Functional Pain Scale - descriptive words and definitions  Unmanageable (7)  Pain interferes with normal ADL's/nothing seems to help/sleep is very difficult/active distractions are very difficult to concentrate on. Severe range order  Average Pain 10   Lower back pain in the middle that radiates into both legs

## 2022-09-24 ENCOUNTER — Ambulatory Visit: Payer: Medicare HMO | Admitting: Physical Medicine and Rehabilitation

## 2022-09-24 ENCOUNTER — Other Ambulatory Visit: Payer: Self-pay

## 2022-09-24 VITALS — BP 136/91 | HR 102

## 2022-09-24 DIAGNOSIS — M47816 Spondylosis without myelopathy or radiculopathy, lumbar region: Secondary | ICD-10-CM

## 2022-09-24 DIAGNOSIS — S39012S Strain of muscle, fascia and tendon of lower back, sequela: Secondary | ICD-10-CM

## 2022-09-24 DIAGNOSIS — M545 Low back pain, unspecified: Secondary | ICD-10-CM

## 2022-09-24 MED ORDER — METHYLPREDNISOLONE ACETATE 80 MG/ML IJ SUSP
80.0000 mg | Freq: Once | INTRAMUSCULAR | Status: AC
Start: 2022-09-24 — End: 2022-09-24
  Administered 2022-09-24: 80 mg

## 2022-09-24 NOTE — Patient Instructions (Signed)

## 2022-09-24 NOTE — Progress Notes (Signed)
Functional Pain Scale - descriptive words and definitions  Distressing (6)    Pain is present/unable to complete most ADLs limited by pain/sleep is difficult and active distraction is only marginal. Moderate range order  Average Pain 9   +Driver, -BT, -Dye Allergies.  Lower back pain on both sides 

## 2022-09-26 NOTE — Procedures (Signed)
Lumbar Facet Joint Nerve Denervation  Patient: Ariel Fisher      Date of Birth: 30-Aug-1945 MRN: [id number] PCP: Andi Devon, MD      Visit Date: 09/24/2022   Universal Protocol:    Date/Time: 06/07/249:15 AM  Consent Given By: the patient  Position: PRONE  Additional Comments: Vital signs were monitored before and after the procedure. Patient was prepped and draped in the usual sterile fashion. The correct patient, procedure, and site was verified.   Injection Procedure Details:   Procedure diagnoses:  1. Spondylosis without myelopathy or radiculopathy, lumbar region   2. Strain of lumbar region, sequela   3. Chronic bilateral low back pain without sciatica      Meds Administered:  Meds ordered this encounter  Medications   methylPREDNISolone acetate (DEPO-MEDROL) injection 80 mg     Laterality: Bilateral  Location/Site:  L5-S1, L4 medial branch and L5 dorsal ramus  Needle: 18 ga.,  10mm active tip, RF Cannula  Needle Placement: Along juncture of superior articular process and transverse pocess  Findings:  -Comments:  Procedure Details: For each desired target nerve, the corresponding transverse process (sacral ala for the L5 dorsal rami) was identified and the fluoroscope was positioned to square off the endplates of the corresponding vertebral body to achieve a true AP midline view.  The beam was then obliqued 15 to 20 degrees and caudally tilted 15 to 20 degrees to line up a trajectory along the target nerves. The skin over the target of the junction of superior articulating process and transverse process (sacral ala for the L5 dorsal rami) was infiltrated with 1ml of 1% Lidocaine without Epinephrine.  The 18 gauge 10mm active tip outer cannula was advanced in trajectory view to the target.  This procedure was repeated for each target nerve.  Then, for all levels, the outer cannula placement was fine-tuned and the position was then confirmed with  bi-planar imaging.    Test stimulation was done both at sensory and motor levels to ensure there was no radicular stimulation. The target tissues were then infiltrated with 1 ml of 1% Lidocaine without Epinephrine. Subsequently, a percutaneous neurotomy was carried out for 90 seconds at 80 degrees Celsius.  After the completion of the lesion, 1 ml of injectate was delivered. It was then repeated for each facet joint nerve mentioned above. Appropriate radiographs were obtained to verify the probe placement during the neurotomy.   Additional Comments:  The patient tolerated the procedure well Dressing: 2 x 2 sterile gauze and Band-Aid    Post-procedure details: Patient was observed during the procedure. Post-procedure instructions were reviewed.  Patient left the clinic in stable condition.

## 2022-09-26 NOTE — Progress Notes (Signed)
Ariel Fisher - 77 y.o. female MRN [id number]  Date of birth: November 10, 1945  Office Visit Note: Visit Date: 09/24/2022 PCP: Andi Devon, MD Referred by: Andi Devon, MD  Subjective: Chief Complaint  Patient presents with   Lower Back - Pain   HPI:  Ariel Fisher is a 77 y.o. female who comes in todayfor planned repeat radiofrequency ablation of the Bilateral L5-S1  Lumbar facet joints. This would be ablation of the corresponding medial branches and/or dorsal rami.  Patient has had double diagnostic blocks with more than 70% relief.  Subsequent ablation gave them more than 6 months of over 60% relief.  They have had chronic back pain for quite some time, more than 3 months, which has been an ongoing situation with recalcitrant axial back pain.  They have no radicular pain.  Their axial pain is worse with standing and ambulating and on exam today with facet loading.  They have had physical therapy as well as home exercise program.  The imaging noted in the chart below indicated facet pathology. Accordingly they meet all the criteria and qualification for for radiofrequency ablation and we are going to complete this today hopefully for more longer term relief as part of comprehensive management program.   ROS Otherwise per HPI.  Assessment & Plan: Visit Diagnoses:    ICD-10-CM   1. Spondylosis without myelopathy or radiculopathy, lumbar region  M47.816 XR C-ARM NO REPORT    Radiofrequency,Lumbar    methylPREDNISolone acetate (DEPO-MEDROL) injection 80 mg    Ambulatory referral to Physical Therapy    2. Strain of lumbar region, sequela  S39.012S Ambulatory referral to Physical Therapy    3. Chronic bilateral low back pain without sciatica  M54.50 Ambulatory referral to Physical Therapy   G89.29       Plan: No additional findings.   Meds & Orders:  Meds ordered this encounter  Medications   methylPREDNISolone acetate (DEPO-MEDROL) injection 80 mg    Orders  Placed This Encounter  Procedures   Radiofrequency,Lumbar   XR C-ARM NO REPORT   Ambulatory referral to Physical Therapy    Follow-up: Return for visit to requesting provider as needed.   Procedures: No procedures performed  Lumbar Facet Joint Nerve Denervation  Patient: Ariel Fisher      Date of Birth: 1945/05/28 MRN: [id number] PCP: Andi Devon, MD      Visit Date: 09/24/2022   Universal Protocol:    Date/Time: 06/07/249:15 AM  Consent Given By: the patient  Position: PRONE  Additional Comments: Vital signs were monitored before and after the procedure. Patient was prepped and draped in the usual sterile fashion. The correct patient, procedure, and site was verified.   Injection Procedure Details:   Procedure diagnoses:  1. Spondylosis without myelopathy or radiculopathy, lumbar region   2. Strain of lumbar region, sequela   3. Chronic bilateral low back pain without sciatica      Meds Administered:  Meds ordered this encounter  Medications   methylPREDNISolone acetate (DEPO-MEDROL) injection 80 mg     Laterality: Bilateral  Location/Site:  L5-S1, L4 medial branch and L5 dorsal ramus  Needle: 18 ga.,  10mm active tip, RF Cannula  Needle Placement: Along juncture of superior articular process and transverse pocess  Findings:  -Comments:  Procedure Details: For each desired target nerve, the corresponding transverse process (sacral ala for the L5 dorsal rami) was identified and the fluoroscope was positioned to square off the endplates of the corresponding vertebral body to  achieve a true AP midline view.  The beam was then obliqued 15 to 20 degrees and caudally tilted 15 to 20 degrees to line up a trajectory along the target nerves. The skin over the target of the junction of superior articulating process and transverse process (sacral ala for the L5 dorsal rami) was infiltrated with 1ml of 1% Lidocaine without Epinephrine.  The 18 gauge 10mm  active tip outer cannula was advanced in trajectory view to the target.  This procedure was repeated for each target nerve.  Then, for all levels, the outer cannula placement was fine-tuned and the position was then confirmed with bi-planar imaging.    Test stimulation was done both at sensory and motor levels to ensure there was no radicular stimulation. The target tissues were then infiltrated with 1 ml of 1% Lidocaine without Epinephrine. Subsequently, a percutaneous neurotomy was carried out for 90 seconds at 80 degrees Celsius.  After the completion of the lesion, 1 ml of injectate was delivered. It was then repeated for each facet joint nerve mentioned above. Appropriate radiographs were obtained to verify the probe placement during the neurotomy.   Additional Comments:  The patient tolerated the procedure well Dressing: 2 x 2 sterile gauze and Band-Aid    Post-procedure details: Patient was observed during the procedure. Post-procedure instructions were reviewed.  Patient left the clinic in stable condition.       Clinical History: MRI LUMBAR SPINE WITHOUT CONTRAST   TECHNIQUE: Multiplanar, multisequence MR imaging of the lumbar spine was performed. No intravenous contrast was administered.   COMPARISON:  Radiography 07/10/2020. CT 03/18/2013. MRI 06/12/2012.   FINDINGS: Segmentation:  5 lumbar type vertebral bodies.   Alignment:  No malalignment.   Vertebrae: Some discogenic endplate marrow change at L2-3, including some active edema, which can be associated with regional pain.   Conus medullaris and cauda equina: Conus extends to the L1-2 level. Conus and cauda equina appear normal.   Paraspinal and other soft tissues: Negative   Disc levels:   No disc level abnormality from T10-11 through L1-2.   L2-3: Disc degeneration with loss of disc height. Circumferential bulging. Facet and ligamentous hypertrophy with small joint effusions. Moderate multifactorial  stenosis at this level that could be symptomatic from the standpoint of neural compression. The degenerative endplate changes and degenerative facet arthropathy could certainly be painful.   L3-4: Normal appearance of the disc. Minimal facet and ligamentous prominence. No stenosis.   L4-5: Previous posterior decompression, diskectomy and fusion. Solid union with wide patency of the canal and foramina.   L5-S1: Mild desiccation of the disc without bulge or herniation. Bilateral facet osteoarthritis. No compressive narrowing of the canal or foramina. The arthritis could contribute to low back pain or referred facet syndrome pain.   IMPRESSION: 1. Good appearance at the decompression and fusion level of L4-5. 2. L2-3: Disc degeneration with loss of disc height. Circumferential bulging of the disc. Facet and ligamentous hypertrophy with small joint effusions. Moderate multifactorial stenosis at this level that could be symptomatic from the standpoint of neural compression. The degenerative endplate changes and facet arthropathy could certainly be painful as well. 3. L5-S1: Bilateral facet osteoarthritis. No compressive narrowing of the canal or foramina. The arthritis could contribute to low back pain or referred facet syndrome pain.     Electronically Signed   By: Paulina Fusi M.D.   On: 07/08/2022 16:42     Objective:  VS:  HT:    WT:   BMI:  BP:(!) 136/91  HR:(!) 102bpm  TEMP: ( )  RESP:  Physical Exam Vitals and nursing note reviewed.  Constitutional:      General: She is not in acute distress.    Appearance: Normal appearance. She is not ill-appearing.  HENT:     Head: Normocephalic and atraumatic.     Right Ear: External ear normal.     Left Ear: External ear normal.  Eyes:     Extraocular Movements: Extraocular movements intact.  Cardiovascular:     Rate and Rhythm: Normal rate.     Pulses: Normal pulses.  Pulmonary:     Effort: Pulmonary effort is  normal. No respiratory distress.  Abdominal:     General: There is no distension.     Palpations: Abdomen is soft.  Musculoskeletal:        General: Tenderness present.     Cervical back: Neck supple.     Right lower leg: No edema.     Left lower leg: No edema.     Comments: Patient has good distal strength with no pain over the greater trochanters.  No clonus or focal weakness.  Skin:    Findings: No erythema, lesion or rash.  Neurological:     General: No focal deficit present.     Mental Status: She is alert and oriented to person, place, and time.     Sensory: No sensory deficit.     Motor: No weakness or abnormal muscle tone.     Coordination: Coordination normal.  Psychiatric:        Mood and Affect: Mood normal.        Behavior: Behavior normal.      Imaging: No results found.

## 2022-10-04 ENCOUNTER — Other Ambulatory Visit: Payer: Self-pay | Admitting: Physical Medicine and Rehabilitation

## 2022-11-12 ENCOUNTER — Other Ambulatory Visit: Payer: Self-pay | Admitting: Physical Medicine and Rehabilitation

## 2022-11-24 ENCOUNTER — Other Ambulatory Visit: Payer: Self-pay | Admitting: Physical Medicine and Rehabilitation

## 2022-11-25 ENCOUNTER — Other Ambulatory Visit: Payer: Self-pay | Admitting: Physical Medicine and Rehabilitation

## 2022-11-25 MED ORDER — MELOXICAM 15 MG PO TABS: 15.0000 mg | ORAL_TABLET | Freq: Every day | ORAL | 0 refills | Status: AC

## 2022-12-16 ENCOUNTER — Other Ambulatory Visit: Payer: Self-pay | Admitting: Physical Medicine and Rehabilitation

## 2023-02-18 ENCOUNTER — Other Ambulatory Visit: Payer: Self-pay | Admitting: Internal Medicine

## 2023-02-18 DIAGNOSIS — Z1231 Encounter for screening mammogram for malignant neoplasm of breast: Secondary | ICD-10-CM

## 2023-03-26 ENCOUNTER — Ambulatory Visit
Admission: RE | Admit: 2023-03-26 | Discharge: 2023-03-26 | Disposition: A | Discharge status: Home / Self Care | Payer: Medicare HMO | Source: Ambulatory Visit | Attending: Internal Medicine | Admitting: Internal Medicine

## 2023-03-26 DIAGNOSIS — Z1231 Encounter for screening mammogram for malignant neoplasm of breast: Secondary | ICD-10-CM

## 2023-04-03 ENCOUNTER — Telehealth: Payer: Self-pay | Admitting: Physical Medicine and Rehabilitation

## 2023-04-03 NOTE — Telephone Encounter (Signed)
Patient called and wants to come in for back injections. ASAP she ask. CB#[phone]

## 2023-04-09 ENCOUNTER — Encounter: Payer: Self-pay | Admitting: Physical Medicine and Rehabilitation

## 2023-04-09 ENCOUNTER — Ambulatory Visit: Payer: Medicare HMO | Admitting: Physical Medicine and Rehabilitation

## 2023-04-09 DIAGNOSIS — M5416 Radiculopathy, lumbar region: Secondary | ICD-10-CM

## 2023-04-09 DIAGNOSIS — G8929 Other chronic pain: Secondary | ICD-10-CM | POA: Diagnosis not present

## 2023-04-09 DIAGNOSIS — M5442 Lumbago with sciatica, left side: Secondary | ICD-10-CM | POA: Diagnosis not present

## 2023-04-09 DIAGNOSIS — M48062 Spinal stenosis, lumbar region with neurogenic claudication: Secondary | ICD-10-CM | POA: Diagnosis not present

## 2023-04-09 MED ORDER — OXYCODONE-ACETAMINOPHEN 5-325 MG PO TABS: 1.0000 | ORAL_TABLET | Freq: Three times a day (TID) | ORAL | 0 refills | Status: AC | PRN

## 2023-04-09 NOTE — Progress Notes (Signed)
Ariel Fisher - 77 y.o. female MRN [id number]  Date of birth: 1945/06/19  Office Visit Note: Visit Date: 04/09/2023 PCP: Andi Devon, MD Referred by: Andi Devon, MD  Subjective: Chief Complaint  Patient presents with   Lower Back - Pain   HPI: Ariel Fisher is a 77 y.o. female who comes in today for evaluation of chronic, worsening and severe bilateral lower back pain radiating down left buttock and lateral leg to foot. Pain ongoing for several years, worsens with standing and walking. She describes pain as sore, aching and throbbing sensation, currently rates as 8 out of 10. Some relief of pain with home exercise regimen, rest and use of medications. Currently Gabapentin 600 mg at bedtime. Lumbar MRI imaging from March of this year exhibits prior decompression and fusion at L4-L5. There is also moderate multifactorial stenosis at the level of L2-L3. She has undergone multiple interventional procedures in our office over the years including lumbar epidural steroid injections and lumbar radiofrequency ablation. Most recent was bilateral L5-S1 radiofrequency ablation on 09/24/2022, provided minimal pain relief for about 4 months. History of posterior lumbar fusion at L4-L5 by Dr. Tressie Stalker in 2015.  She is currently helping care for her husband whom suffered from stroke in April, also reports grandchildren live with her. Patient denies focal weakness, numbness and tingling. No recent trauma or falls.      Review of Systems  Musculoskeletal:  Positive for back pain.  Neurological:  Negative for tingling, sensory change, focal weakness and weakness.  All other systems reviewed and are negative.  Otherwise per HPI.  Assessment & Plan: Visit Diagnoses:    ICD-10-CM   1. Chronic left-sided low back pain with left-sided sciatica  M54.42 Ambulatory referral to Physical Medicine Rehab   G89.29     2. Lumbar radiculopathy  M54.16 Ambulatory referral to Physical  Medicine Rehab    3. Spinal stenosis of lumbar region with neurogenic claudication  M48.062 Ambulatory referral to Physical Medicine Rehab       Plan: Findings:  Chronic, worsening and severe left sided lower back pain radiating to buttock and down lateral leg to foot. Patient continues to have severe pain despite good conservative therapies such as home exercise regimen, rest and use of medications. Patients clinical presentation and exam are consistent with neurogenic claudication as result of spinal canal stenosis. There is moderate multi factorial spinal canal stenosis at L2-L3. We discussed treatment plan in detail today, next step is to perform diagnostic and hopefully therapeutic left L3-L4 interlaminar epidural steroid injection under fluoroscopic guidance. She is not currently taking anticoagulants. If good relief of pain with injection we can repeat this procedure infrequently as needed. She is not really interested in further surgical intervention at this point. We also discussed medication management, I prescribed short course of Percocet for patient to take until we are able to get her in for injection. I encouraged patient to remain active as tolerated. No red flag symptoms noted upon exam today.   Screening for Osteoporosis for Women Aged 39-13 Years of Age:    Patient has had a central dual-energy X-ray absorptiometry (DXA) to check for osteoporosis.   Today's note sent to PCP on record. Patient does not need a referral to [facility] osteoporosis clinic.      Meds & Orders:  Meds ordered this encounter  Medications   oxyCODONE-acetaminophen (PERCOCET/ROXICET) 5-325 MG tablet    Sig: Take 1 tablet by mouth every 8 (eight) hours as needed for severe  pain (pain score 7-10).    Dispense:  20 tablet    Refill:  0    Orders Placed This Encounter  Procedures   Ambulatory referral to Physical Medicine Rehab    Follow-up: Return for Left L3-L4 interlaminar epidural steroid  injection.   Procedures: No procedures performed      Clinical History: MRI LUMBAR SPINE WITHOUT CONTRAST   TECHNIQUE: Multiplanar, multisequence MR imaging of the lumbar spine was performed. No intravenous contrast was administered.   COMPARISON:  Radiography 07/10/2020. CT 03/18/2013. MRI 06/12/2012.   FINDINGS: Segmentation:  5 lumbar type vertebral bodies.   Alignment:  No malalignment.   Vertebrae: Some discogenic endplate marrow change at L2-3, including some active edema, which can be associated with regional pain.   Conus medullaris and cauda equina: Conus extends to the L1-2 level. Conus and cauda equina appear normal.   Paraspinal and other soft tissues: Negative   Disc levels:   No disc level abnormality from T10-11 through L1-2.   L2-3: Disc degeneration with loss of disc height. Circumferential bulging. Facet and ligamentous hypertrophy with small joint effusions. Moderate multifactorial stenosis at this level that could be symptomatic from the standpoint of neural compression. The degenerative endplate changes and degenerative facet arthropathy could certainly be painful.   L3-4: Normal appearance of the disc. Minimal facet and ligamentous prominence. No stenosis.   L4-5: Previous posterior decompression, diskectomy and fusion. Solid union with wide patency of the canal and foramina.   L5-S1: Mild desiccation of the disc without bulge or herniation. Bilateral facet osteoarthritis. No compressive narrowing of the canal or foramina. The arthritis could contribute to low back pain or referred facet syndrome pain.   IMPRESSION: 1. Good appearance at the decompression and fusion level of L4-5. 2. L2-3: Disc degeneration with loss of disc height. Circumferential bulging of the disc. Facet and ligamentous hypertrophy with small joint effusions. Moderate multifactorial stenosis at this level that could be symptomatic from the standpoint of neural  compression. The degenerative endplate changes and facet arthropathy could certainly be painful as well. 3. L5-S1: Bilateral facet osteoarthritis. No compressive narrowing of the canal or foramina. The arthritis could contribute to low back pain or referred facet syndrome pain.     Electronically Signed   By: Paulina Fusi M.D.   On: 07/08/2022 16:42   She reports that she has never smoked. She has never used smokeless tobacco. No results for input(s): "HGBA1C", "LABURIC" in the last 8760 hours.  Objective:  VS:  HT:    WT:   BMI:     BP:   HR: bpm  TEMP: ( )  RESP:  Physical Exam Vitals and nursing note reviewed.  HENT:     Head: Normocephalic and atraumatic.     Right Ear: External ear normal.     Left Ear: External ear normal.     Nose: Nose normal.     Mouth/Throat:     Mouth: Mucous membranes are moist.  Eyes:     Extraocular Movements: Extraocular movements intact.  Cardiovascular:     Rate and Rhythm: Normal rate.     Pulses: Normal pulses.  Pulmonary:     Effort: Pulmonary effort is normal.  Abdominal:     General: Abdomen is flat. There is no distension.  Musculoskeletal:        General: Tenderness present.     Cervical back: Normal range of motion.     Comments: Patient rises from seated position to standing without difficulty.  Good lumbar range of motion. No pain noted with facet loading. 5/5 strength noted with bilateral hip flexion, knee flexion/extension, ankle dorsiflexion/plantarflexion and EHL. No clonus noted bilaterally. No pain upon palpation of greater trochanters. No pain with internal/external rotation of bilateral hips. Sensation intact bilaterally. Negative slump test bilaterally. Ambulates without aid, gait steady.     Skin:    General: Skin is warm and dry.     Capillary Refill: Capillary refill takes less than 2 seconds.  Neurological:     General: No focal deficit present.     Mental Status: She is alert and oriented to person, place, and  time.  Psychiatric:        Mood and Affect: Mood normal.        Behavior: Behavior normal.     Ortho Exam  Imaging: No results found.  Past Medical/Family/Surgical/Social History: Medications & Allergies reviewed per EMR, new medications updated. 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   Spondylolisthesis of lumbar region 08/08/2013   Past Medical History:  Diagnosis Date   Anxiety    Depression    Hypertension    Family History  Problem Relation Age of Onset   Heart disease Mother    Liver disease Father    Non-Hodgkin's lymphoma Sister    Liver disease Sister    BRCA 1/2 Neg Hx    Breast cancer Neg Hx    Past Surgical History:  Procedure Laterality Date   BILATERAL CARPAL TUNNEL RELEASE     CARDIAC CATHETERIZATION     15 yrs ago ..clean   CESAREAN SECTION     TOTAL KNEE ARTHROPLASTY Left 02/14/2019   Procedure: TOTAL KNEE ARTHROPLASTY;  Surgeon: Dannielle Huh, MD;  Location: [facility];  Service: Orthopedics;  Laterality: Left;  75 mins needed for length of case   TOTAL KNEE ARTHROPLASTY Right 04/11/2019   Procedure: TOTAL KNEE ARTHROPLASTY;  Surgeon: Dannielle Huh, MD;  Location: [facility];  Service: Orthopedics;  Laterality: Right;  SAME DAY DISCHARGE   Social History   Occupational History   Not on file  Tobacco Use   Smoking status: Never   Smokeless tobacco: Never  Vaping Use   Vaping status: Never Used  Substance and Sexual Activity   Alcohol use: Yes    Alcohol/week: 1.0 standard drink of alcohol    Types: 1 Glasses of wine per week    Comment: occ   Drug use: No   Sexual activity: Not on file

## 2023-05-04 ENCOUNTER — Other Ambulatory Visit: Payer: Self-pay

## 2023-05-04 ENCOUNTER — Ambulatory Visit: Payer: Medicare HMO | Admitting: Physical Medicine and Rehabilitation

## 2023-05-04 DIAGNOSIS — M5416 Radiculopathy, lumbar region: Secondary | ICD-10-CM

## 2023-05-04 MED ORDER — METHYLPREDNISOLONE ACETATE 40 MG/ML IJ SUSP
40.0000 mg | Freq: Once | INTRAMUSCULAR | Status: AC
  Administered 2023-05-04: 40 mg

## 2023-05-04 NOTE — Progress Notes (Signed)
 Functional Pain Scale - descriptive words and definitions  Uncomfortable (3)  Pain is present but can complete all ADL's/sleep is slightly affected and passive distraction only gives marginal relief. Mild range order  Average Pain 4 She stated her L > R  She is having tingling down her L side in her Big toe  +Driver, -BT, -Dye Allergies.

## 2023-05-04 NOTE — Progress Notes (Signed)
 Ariel Fisher - 78 y.o. female MRN [id number]  Date of birth: Sep 19, 1945  Office Visit Note: Visit Date: 05/04/2023 PCP: Theo Iha, MD Referred by: Theo Iha, MD  Subjective: Chief Complaint  Patient presents with   Lower Back - Pain   HPI:  Ariel Fisher is a 78 y.o. female who comes in today at the request of Duwaine Pouch, FNP for planned Left L3-4 Lumbar Interlaminar epidural steroid injection with fluoroscopic guidance.  The patient has failed conservative care including home exercise, medications, time and activity modification.  This injection will be diagnostic and hopefully therapeutic.  Please see requesting physician notes for further details and justification. See report but some initial difficulty in getting into epidural space. Consider transforaminal injection in future. Consider Spinal cord stimulator vs f/up with Spine surgeon.   ROS Otherwise per HPI.  Assessment & Plan: Visit Diagnoses:    ICD-10-CM   1. Lumbar radiculopathy  M54.16 XR C-ARM NO REPORT    Epidural Steroid injection    methylPREDNISolone  acetate (DEPO-MEDROL ) injection 40 mg      Plan: No additional findings.   Meds & Orders:  Meds ordered this encounter  Medications   methylPREDNISolone  acetate (DEPO-MEDROL ) injection 40 mg    Orders Placed This Encounter  Procedures   XR C-ARM NO REPORT   Epidural Steroid injection    Follow-up: Return for visit to requesting provider as needed.   Procedures: No procedures performed  Lumbar Epidural Steroid Injection - Interlaminar Approach with Fluoroscopic Guidance  Patient: Ariel Fisher      Date of Birth: 05-31-1945 MRN: [id number] PCP: Theo Iha, MD      Visit Date: 05/04/2023   Universal Protocol:     Consent Given By: the patient  Position: PRONE  Additional Comments: Vital signs were monitored before and after the procedure. Patient was prepped and draped in the usual sterile fashion. The  correct patient, procedure, and site was verified.   Injection Procedure Details:   Procedure diagnoses: Lumbar radiculopathy [M54.16]   Meds Administered:  Meds ordered this encounter  Medications   methylPREDNISolone  acetate (DEPO-MEDROL ) injection 40 mg     Laterality: Left  Location/Site:  L3-4  Needle: 4.5 in., 20 ga. Tuohy  Needle Placement: Paramedian epidural  Findings:   -Comments: Excellent flow of contrast into the epidural space.  Initial paracentral approach from the left would like an open space however we were met with bony prominence and just really could not achieve loss of resistance.  I did obtain a little bit more caudal tilt and more obliquity to the left and with 3 numbing the skin proceeded to have really no significant difficulties achieving the epidural space at that point.  Patient did well.  Procedure Details: Using a paramedian approach from the side mentioned above, the region overlying the inferior lamina was localized under fluoroscopic visualization and the soft tissues overlying this structure were infiltrated with 4 ml. of 1% Lidocaine  without Epinephrine . The Tuohy needle was inserted into the epidural space using a paramedian approach.   The epidural space was localized using loss of resistance along with counter oblique bi-planar fluoroscopic views.  After negative aspirate for air, blood, and CSF, a 2 ml. volume of Isovue-250 was injected into the epidural space and the flow of contrast was observed. Radiographs were obtained for documentation purposes.    The injectate was administered into the level noted above.   Additional Comments:  The patient tolerated the procedure well Dressing: 2 x  2 sterile gauze and Band-Aid    Post-procedure details: Patient was observed during the procedure. Post-procedure instructions were reviewed.  Patient left the clinic in stable condition.   Clinical History: MRI LUMBAR SPINE WITHOUT CONTRAST    TECHNIQUE: Multiplanar, multisequence MR imaging of the lumbar spine was performed. No intravenous contrast was administered.   COMPARISON:  Radiography 07/10/2020. CT 03/18/2013. MRI 06/12/2012.   FINDINGS: Segmentation:  5 lumbar type vertebral bodies.   Alignment:  No malalignment.   Vertebrae: Some discogenic endplate marrow change at L2-3, including some active edema, which can be associated with regional pain.   Conus medullaris and cauda equina: Conus extends to the L1-2 level. Conus and cauda equina appear normal.   Paraspinal and other soft tissues: Negative   Disc levels:   No disc level abnormality from T10-11 through L1-2.   L2-3: Disc degeneration with loss of disc height. Circumferential bulging. Facet and ligamentous hypertrophy with small joint effusions. Moderate multifactorial stenosis at this level that could be symptomatic from the standpoint of neural compression. The degenerative endplate changes and degenerative facet arthropathy could certainly be painful.   L3-4: Normal appearance of the disc. Minimal facet and ligamentous prominence. No stenosis.   L4-5: Previous posterior decompression, diskectomy and fusion. Solid union with wide patency of the canal and foramina.   L5-S1: Mild desiccation of the disc without bulge or herniation. Bilateral facet osteoarthritis. No compressive narrowing of the canal or foramina. The arthritis could contribute to low back pain or referred facet syndrome pain.   IMPRESSION: 1. Good appearance at the decompression and fusion level of L4-5. 2. L2-3: Disc degeneration with loss of disc height. Circumferential bulging of the disc. Facet and ligamentous hypertrophy with small joint effusions. Moderate multifactorial stenosis at this level that could be symptomatic from the standpoint of neural compression. The degenerative endplate changes and facet arthropathy could certainly be painful as well. 3. L5-S1:  Bilateral facet osteoarthritis. No compressive narrowing of the canal or foramina. The arthritis could contribute to low back pain or referred facet syndrome pain.     Electronically Signed   By: Oneil Officer M.D.   On: 07/08/2022 16:42     Objective:  VS:  HT:    WT:   BMI:     BP:   HR: bpm  TEMP: ( )  RESP:  Physical Exam Vitals and nursing note reviewed.  Constitutional:      General: She is not in acute distress.    Appearance: Normal appearance. She is obese. She is not ill-appearing.  HENT:     Head: Normocephalic and atraumatic.     Right Ear: External ear normal.     Left Ear: External ear normal.  Eyes:     Extraocular Movements: Extraocular movements intact.  Cardiovascular:     Rate and Rhythm: Normal rate.     Pulses: Normal pulses.  Pulmonary:     Effort: Pulmonary effort is normal. No respiratory distress.  Abdominal:     General: There is no distension.     Palpations: Abdomen is soft.  Musculoskeletal:        General: Tenderness present.     Cervical back: Neck supple.     Right lower leg: No edema.     Left lower leg: No edema.     Comments: Patient has good distal strength with no pain over the greater trochanters.  No clonus or focal weakness.  Skin:    Findings: No erythema, lesion or rash.  Neurological:     General: No focal deficit present.     Mental Status: She is alert and oriented to person, place, and time.     Sensory: No sensory deficit.     Motor: No weakness or abnormal muscle tone.     Coordination: Coordination normal.  Psychiatric:        Mood and Affect: Mood normal.        Behavior: Behavior normal.      Imaging: XR C-ARM NO REPORT Result Date: 05/04/2023 Please see Notes tab for imaging impression.

## 2023-05-04 NOTE — Procedures (Signed)
 Lumbar Epidural Steroid Injection - Interlaminar Approach with Fluoroscopic Guidance  Patient: Ariel Fisher      Date of Birth: Oct 02, 1945 MRN: [id number] PCP: Theo Iha, MD      Visit Date: 05/04/2023   Universal Protocol:     Consent Given By: the patient  Position: PRONE  Additional Comments: Vital signs were monitored before and after the procedure. Patient was prepped and draped in the usual sterile fashion. The correct patient, procedure, and site was verified.   Injection Procedure Details:   Procedure diagnoses: Lumbar radiculopathy [M54.16]   Meds Administered:  Meds ordered this encounter  Medications   methylPREDNISolone  acetate (DEPO-MEDROL ) injection 40 mg     Laterality: Left  Location/Site:  L3-4  Needle: 4.5 in., 20 ga. Tuohy  Needle Placement: Paramedian epidural  Findings:   -Comments: Excellent flow of contrast into the epidural space.  Initial paracentral approach from the left would like an open space however we were met with bony prominence and just really could not achieve loss of resistance.  I did obtain a little bit more caudal tilt and more obliquity to the left and with 3 numbing the skin proceeded to have really no significant difficulties achieving the epidural space at that point.  Patient did well.  Procedure Details: Using a paramedian approach from the side mentioned above, the region overlying the inferior lamina was localized under fluoroscopic visualization and the soft tissues overlying this structure were infiltrated with 4 ml. of 1% Lidocaine  without Epinephrine . The Tuohy needle was inserted into the epidural space using a paramedian approach.   The epidural space was localized using loss of resistance along with counter oblique bi-planar fluoroscopic views.  After negative aspirate for air, blood, and CSF, a 2 ml. volume of Isovue-250 was injected into the epidural space and the flow of contrast was observed.  Radiographs were obtained for documentation purposes.    The injectate was administered into the level noted above.   Additional Comments:  The patient tolerated the procedure well Dressing: 2 x 2 sterile gauze and Band-Aid    Post-procedure details: Patient was observed during the procedure. Post-procedure instructions were reviewed.  Patient left the clinic in stable condition.

## 2023-09-21 ENCOUNTER — Other Ambulatory Visit: Payer: Self-pay | Admitting: Family Medicine

## 2023-09-21 DIAGNOSIS — M545 Low back pain, unspecified: Secondary | ICD-10-CM

## 2023-09-28 ENCOUNTER — Ambulatory Visit
Admission: RE | Admit: 2023-09-28 | Discharge: 2023-09-28 | Disposition: A | Discharge status: Home / Self Care | Source: Ambulatory Visit | Attending: Family Medicine | Admitting: Family Medicine

## 2023-09-28 DIAGNOSIS — M545 Low back pain, unspecified: Secondary | ICD-10-CM

## 2024-02-22 ENCOUNTER — Encounter: Payer: Self-pay | Admitting: Radiology

## 2024-03-02 ENCOUNTER — Other Ambulatory Visit: Payer: Self-pay | Admitting: Internal Medicine

## 2024-03-02 DIAGNOSIS — Z1231 Encounter for screening mammogram for malignant neoplasm of breast: Secondary | ICD-10-CM

## 2024-03-28 ENCOUNTER — Ambulatory Visit

## 2024-04-19 ENCOUNTER — Other Ambulatory Visit: Payer: Self-pay | Admitting: Physical Medicine and Rehabilitation

## 2024-04-19 DIAGNOSIS — L042 Acute lymphadenitis of upper limb: Secondary | ICD-10-CM
